# Patient Record
Sex: Female | Born: 1980 | Race: Black or African American | Hispanic: No | Marital: Single | Smoking: Never smoker
Health system: Southern US, Community
[De-identification: ages and names within clinical notes are randomized; demographics above are authoritative.]

---

## 2010-09-30 ENCOUNTER — Ambulatory Visit: Payer: Self-pay | Admitting: Family Medicine

## 2010-09-30 DIAGNOSIS — G43009 Migraine without aura, not intractable, without status migrainosus: Secondary | ICD-10-CM | POA: Insufficient documentation

## 2010-09-30 DIAGNOSIS — J069 Acute upper respiratory infection, unspecified: Secondary | ICD-10-CM | POA: Insufficient documentation

## 2010-11-22 ENCOUNTER — Ambulatory Visit
Admission: RE | Admit: 2010-11-22 | Discharge: 2010-11-22 | Payer: Self-pay | Source: Home / Self Care | Attending: Family Medicine | Admitting: Family Medicine

## 2010-11-23 NOTE — Assessment & Plan Note (Signed)
Summary: NOV: URI, HA, etc   Vital Signs:  Patient profile:   30 year old Sheryl [name] Height:      64.5 inches Weight:      121 pounds Pulse rate:   86 / minute BP sitting:   123 / 77  (right arm) Cuff size:   regular  Vitals Entered By: Avon Gully CMA, Duncan Dull) (September 30, 2010 1:07 PM) CC: NP-est care cold sx   CC:  NP-est care cold sx.  History of Present Illness: Sxs started about 7 days ago with sinus drainage and started Allegra-D.  After 4-5 days flet her voice going but a little better today. NO ST. Lots of drainage.  No congestion during day but it is worse at night.  No fever. Mild cough. Took some Theraflu a few nights a nights.  + sick contacts.   HA 2-3 x a month for years.  Usually on thr right occiput. Occ HA is sevre and gets nausee and vomiting with it.  No light our sound sensitivity.  Usually takes excedrin Migraine or rapid release Tylenol . Works about half the time. No family hx of migraines.  Usually last 2-3 days.  No worsening or alleviating sxs.   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 Single. LIves with her aunts.  Never Smoked Alcohol use-yes Drug use-no Regular exercise-no 2 caffeinated drinks per day. Smoking Status:  never Does Patient Exercise:  no STD Risk:  never Drug Use:  no Seat Belt Use:  always  Review of Systems       No fever/sweats/weakness, unexplained weight loss/gain.  No vison changes.  No difficulty hearing/ringing in ears, hay  fever/allergies.  No chest pain/discomfort, palpitations.  No Br lump/nipple discharge.  + cough/wheeze.  No blood in BM, nausea/vomiting/diarrhea.  No nighttime urination, leaking urine, unusual vaginal bleeding, discharge (penis or vagina).  No muscle/joint pain. No rash, change in mole.  No HA, memory loss.  No anxiety, sleep d/o, depression.  No easy bruising/bleeding, unexplained lump   Physical Exam  General:  Well-developed,well-nourished,in no acute distress; alert,appropriate and cooperative throughout examination Head:  Normocephalic and atraumatic without obvious abnormalities. No apparent alopecia or balding. Eyes:  No corneal or conjunctival inflammation noted. EOMI. Perrla. Ears:  External ear exam shows no significant lesions or deformities.  Otoscopic examination reveals clear canals, tympanic membranes are intact bilaterally without bulging, retraction, inflammation or discharge. Hearing is grossly normal bilaterally. Nose:  External nasal examination shows no deformity or inflammation. Nasal mucosa are pink and moist without lesions or exudates. Mouth:  Oral mucosa and oropharynx without lesions or exudates.  Teeth in good repair. Neck:  No deformities, masses, or tenderness noted. NO TM.  Lungs:  Normal respiratory effort, chest expands symmetrically. Lungs are clear to auscultation, no crackles or wheezes. Heart:  Normal rate and regular rhythm. S1 and S2 normal without gallop, murmur, click, rub or other extra sounds. Neurologic:  alert & oriented X3,  cranial nerves II-XII intact, and gait normal.   Skin:  no rashes.   Cervical Nodes:  No lymphadenopathy noted Psych:  Cognition and judgment appear intact. Alert and cooperative with normal attention span and concentration. No apparent delusions, illusions, hallucinations   Impression & Recommendations:  Problem # 1:  MIGRAINE WITHOUT AURA (ICD-346.10)  Discussed that she really has migraines.  Let her know that usually we  start with acute treatments with a tryptan. Will start with Imitrex. F/U in 6 weeks.  Can also use her excedrin migraine if needed.    Her updated medication list for this problem includes:    Sumatriptan Succinate 100 Mg Tabs (Sumatriptan succinate) .Marland Kitchen... Take 1 tablet by mouth once a day as needed migrine. repeat dose in 1 hour if needed  Problem # 2:  CONTRACEPTIVE MANAGEMENT (ICD-V25.09) Will start an OCP to help control heaving cramping.  F/U in 1-2 months for a pap smear. She has never had one.   Problem # 3:  URI (ICD-465.9)  Instructed on symptomatic treatment. Call if symptoms persist or worsen. I do think this is viral but could also be allergic. Will add a tiral of nasal steroid for 2 weeks and see if helps. Sample of omnaris give. Call if not responding.    Complete Medication List: 1)  Kariva 0.15-0.02/0.01 Mg (21/5) Tabs (Desogestrel-ethinyl estradiol) .... Take 1 tablet by mouth once a day 2)  Sumatriptan Succinate 100 Mg Tabs (Sumatriptan succinate) .... Take 1 tablet by mouth once a day as needed migrine. repeat dose in 1 hour if needed  Patient Instructions: 1)  For the HA, avoid caffeine, wine, chocolates, and aged cheeses 2)  Try to reduce your stress and get plently of rest. Lack of sleep can trigger migraine Headache.  3)  Please schedule a follow-up appointment in 6 weeks for migraines. 4)  Schedule your pap for anytime.   Prescriptions: SUMATRIPTAN SUCCINATE 100 MG TABS (SUMATRIPTAN SUCCINATE) Take 1 tablet by mouth once a day as needed migrine. Repeat dose in 1 hour if needed  #1 x 1   Entered and Authorized by:   Nani Gasser MD   Signed by:   Nani Gasser MD on 09/30/2010   Method used:   [facility] to        [facility] [phone]* (retail)       [address]       [place], [place]  [address]       Ph: [phone] or [phone]       Fax: [phone]   RxID:   [id number] KARIVA 0.15-0.02/0.01 MG (21/5) TABS (DESOGESTREL-ETHINYL  ESTRADIOL) Take 1 tablet by mouth once a day  #1 pack x 6   Entered and Authorized by:   Nani Gasser MD   Signed by:   Nani Gasser MD on 09/30/2010   Method used:   Electronically to        [facility] [phone]* (retail)       [address]       [place], [place]  [address]       Ph: [phone] or [phone]       Fax: [phone]   RxID:   [id number]    Orders Added: 1)  New Patient Level IV [93267]

## 2010-11-29 ENCOUNTER — Encounter: Payer: Self-pay | Admitting: Family Medicine

## 2010-12-01 NOTE — Assessment & Plan Note (Signed)
Summary: CHK/UP FOR MIGRAINES   Vital Signs:  Patient profile:   30 year old female Height:      64.5 inches Weight:      118 pounds Pulse rate:   90 / minute BP sitting:   117 / 75  (right arm) Cuff size:   regular  Vitals Entered By: Avon Gully CMA, Duncan Dull) (November 22, 2010 8:26 AM) CC: f/u migraines, Headache   CC:  f/u migraines and Headache.  History of Present Illness: Has had to take sumitruptan about 3-4 times. oThe imitrex makes her feel nauseated but started to work within 30 min  to one hour. Stil had some mild HA in between and has ben using Tylenol Rapid release for these.  NO vomiing wiht her migraines.  Didn'th ave to repeat her imitrex dose.   Current Medications (verified): 1)  Kariva 0.15-0.02/0.01 Mg (21/5) Tabs (Desogestrel-Ethinyl Estradiol) .... Take 1 Tablet By Mouth Once A Day 2)  Sumatriptan Succinate 100 Mg Tabs (Sumatriptan Succinate) .... Take 1 Tablet By Mouth Once A Day As Needed Migrine. Repeat Dose in 1 Hour If Needed  Allergies (verified): No Known Drug Allergies  Comments:  Nurse/Medical Assistant: The patient's medications and allergies were reviewed with the patient and were updated in the Medication and Allergy Lists. Avon Gully CMA, Duncan Dull) (November 22, 2010 8:27 AM)  Physical Exam  General:  Well-developed,well-nourished,in no acute distress; alert,appropriate and cooperative throughout examination Lungs:  Normal respiratory effort, chest expands symmetrically. Lungs are clear to auscultation, no crackles or wheezes. Heart:  Normal rate and regular rhythm. S1 and S2 normal without gallop, murmur, click, rub or other extra sounds. Neurologic:  alert & oriented X3.   Skin:  no rashes.   Psych:  Cognition and judgment appear intact. Alert and cooperative with normal attention span and concentration. No apparent delusions, illusions, hallucinations   Impression & Recommendations:  Problem # 1:  MIGRAINE WITHOUT AURA  (ICD-346.10) Imitrex worked well but caused nausea. Will chang to zomig Also discussed that she is a great candidate for prophylaxis. Discussed optoins. Will start with notriptyline. F/U in 3 months with a HA calendar.  Her updated medication list for this problem includes:    Zomig 5 Mg Tabs (Zolmitriptan) .Marland Kitchen... Take 1 tablet by mouth once a day as needed migraine headaches. can repeat dose in 2 hours if needed.  Complete Medication List: 1)  Kariva 0.15-0.02/0.01 Mg (21/5) Tabs (Desogestrel-ethinyl estradiol) .... Take 1 tablet by mouth once a day 2)  Zomig 5 Mg Tabs (Zolmitriptan) .... Take 1 tablet by mouth once a day as needed migraine headaches. can repeat dose in 2 hours if needed. 3)  Nortriptyline Hcl 10 Mg Caps (Nortriptyline hcl) .... Take 1 tablet by mouth once a day  Patient Instructions: 1)  Follow up with me in 3 months 2)  Call if not responding to the zomig rescue medication 3)  Keep a headache calendar as well 4)  Can increase the nortryptiline at bedtime to 2 tabs after one month if needed.  Prescriptions: NORTRIPTYLINE HCL 10 MG CAPS (NORTRIPTYLINE HCL) Take 1 tablet by mouth once a day  #30 x 2   Entered and Authorized by:   Nani Gasser MD   Signed by:   Nani Gasser MD on 11/22/2010   Method used:   [facility]        [facility] [address] [phone]* (retail)       [address]  [place], [place]  [address]       Ph: [phone] or [phone]       Fax: [phone]   RxID:   [id number] ZOMIG 5 MG TABS (ZOLMITRIPTAN) Take 1 tablet by mouth once a day as needed migraine headaches. Can repeat dose in 2 hours if needed.  #6 x 0   Entered and Authorized by:   Nani Gasser MD   Signed by:   Nani Gasser MD on 11/22/2010   Method used:   [facility]ically to        [facility] [phone]* (retail)       [address]       [place], [place]  [address]       Ph: [phone] or [phone]       Fax: [phone]   RxID:    [id number]    Orders Added: 1)  Est. Patient Level III [628[address]]

## 2010-12-09 ENCOUNTER — Telehealth: Payer: Self-pay | Admitting: Family Medicine

## 2010-12-15 NOTE — Progress Notes (Signed)
Summary: Zomig too expensive  Phone Note From Pharmacy   Summary of Call: Pt can not afford Zomig and requests this be changed to cheaper Rx. Please advise. Initial call taken by: Payton Spark CMA,  December 09, 2010 1:33 PM  Follow-up for Phone Call        New one sent. If also too much then she will have to talk to her pharmacistabout what may be on her formulary.  Follow-up by: Nani Gasser MD,  December 09, 2010 1:37 PM  Additional Follow-up for Phone Call Additional follow up Details #1::        left message on pts vm Additional Follow-up by: Avon Gully CMA, Duncan Dull),  December 10, 2010 8:37 AM    New/Updated Medications: RELPAX 20 MG TABS (ELETRIPTAN HYDROBROMIDE) Take 1 tablet by mouth once a day. Can repeat dose in 2 hours if needed. No more than 2 tabs in one day. Prescriptions: RELPAX 20 MG TABS (ELETRIPTAN HYDROBROMIDE) Take 1 tablet by mouth once a day. Can repeat dose in 2 hours if needed. No more than 2 tabs in one day.  #6 x 0   Entered and Authorized by:   Nani Gasser MD   Signed by:   Nani Gasser MD on 12/09/2010   Method used:   [facility] to        [facility] [phone][facility] (retail)       [address]       [place], [place]  [address]       Ph: [phone] or [phone]       Fax: [phone]   RxID:   [id number]

## 2010-12-21 NOTE — Medication Information (Signed)
Summary: Zomig Approved  Zomig Approved   Imported By: Maryln Gottron 12/13/2010 14:12:16  _____________________________________________________________________  External Attachment:    Type:   Image     Comment:   External Document

## 2010-12-24 ENCOUNTER — Encounter: Payer: Self-pay | Admitting: Family Medicine

## 2010-12-28 ENCOUNTER — Encounter: Payer: Self-pay | Admitting: Family Medicine

## 2011-01-25 ENCOUNTER — Encounter: Payer: Self-pay | Admitting: Family Medicine

## 2011-02-03 ENCOUNTER — Encounter: Payer: Self-pay | Admitting: Family Medicine

## 2011-02-04 ENCOUNTER — Encounter: Payer: Self-pay | Admitting: Family Medicine

## 2011-02-04 ENCOUNTER — Ambulatory Visit (INDEPENDENT_AMBULATORY_CARE_PROVIDER_SITE_OTHER): Payer: BC Managed Care – PPO | Admitting: Family Medicine

## 2011-02-04 ENCOUNTER — Other Ambulatory Visit (HOSPITAL_COMMUNITY)
Admission: RE | Admit: 2011-02-04 | Discharge: 2011-02-04 | Disposition: A | Discharge status: Home / Self Care | Payer: BC Managed Care – PPO | Source: Ambulatory Visit | Attending: Family Medicine | Admitting: Family Medicine

## 2011-02-04 VITALS — BP 106/73 | HR 81 | Ht 64.5 in | Wt 119.0 lb

## 2011-02-04 DIAGNOSIS — Z124 Encounter for screening for malignant neoplasm of cervix: Secondary | ICD-10-CM

## 2011-02-04 DIAGNOSIS — Z23 Encounter for immunization: Secondary | ICD-10-CM

## 2011-02-04 DIAGNOSIS — Z01419 Encounter for gynecological examination (general) (routine) without abnormal findings: Secondary | ICD-10-CM | POA: Insufficient documentation

## 2011-02-04 LAB — LIPID PANEL
HDL: 52 mg/dL (ref >39)
LDL Cholesterol: 89 mg/dL (ref 0–99)
Triglycerides: 37 mg/dL (ref <150)
VLDL: 7 mg/dL (ref 0–40)

## 2011-02-04 LAB — CBC
MCHC: 34.7 g/dL (ref 30.0–36.0)
RDW: 12.5 % (ref 11.5–15.5)
WBC: 8.7 10*3/uL (ref 4.0–10.5)

## 2011-02-04 NOTE — Patient Instructions (Signed)
Regular exercise and healthy diet Make sure getting 4 servings of dairy a day or take a calcium supplement.

## 2011-02-04 NOTE — Progress Notes (Signed)
  Subjective:    Patient ID: Sheryl [name], female    DOB: [birth year], 30 y.o.   MRN: [id number]  HPI  No concerns.   Review of Systems     Objective:   Physical Exam        Assessment & Plan:   Subjective:     Sheryl [name] is a 30 y.o. female and is here for a comprehensive physical exam. The patient reports no problems.  History   Social History  . Marital Status: Single    Spouse Name: N/A    Number of Children: N/A  . Years of Education: N/A   Occupational History  . Teacher of Technology     [employer]   Social History Main Topics  . Smoking status: Never Smoker   . Smokeless tobacco: Not on file  . Alcohol Use: Yes  . Drug Use: No  . Sexually Active: Not Currently   Other Topics Concern  . Not on file   Social History Narrative  . No narrative on file   Health Maintenance  Topic Date Due  . Pap Smear  11/12/1999  . Tetanus/tdap  11/11/2000    The following portions of the patient's history were reviewed and updated as appropriate: allergies, current medications, past family history, past medical history, past social history, past surgical history and problem list.  Review of Systems Pertinent items are noted in HPI.   Objective:    General appearance: alert and pale Head: Normocephalic, without obvious abnormality Eyes: conjunctivae/corneas clear. PERRL, EOM's intact. Fundi benign. Ears: normal TM's and external ear canals both ears Nose: Nares normal. Septum midline. Mucosa normal. No drainage or sinus tenderness. Throat: lips, mucosa, and tongue normal; teeth and gums normal Neck: no adenopathy, no carotid bruit, no JVD, supple, symmetrical, trachea midline and thyroid not enlarged, symmetric, no tenderness/mass/nodules Back: symmetric, no curvature. ROM normal. No CVA tenderness. Lungs: clear to auscultation bilaterally Breasts: normal appearance, no masses or tenderness Heart: regular rate and rhythm, S1, S2 normal, no murmur, click,  rub or gallop Abdomen: soft, non-tender; bowel sounds normal; no masses,  no organomegaly Pelvic: cervix normal in appearance, external genitalia normal, no adnexal masses or tenderness, no cervical motion tenderness, rectovaginal septum normal, uterus normal size, shape, and consistency and vagina normal without discharge Extremities: extremities normal, atraumatic, no cyanosis or edema Pulses: 2+ and symmetric Skin: Skin color, texture, turgor normal. No rashes or lesions Lymph nodes: Cervical, supraclavicular, and axillary nodes normal. Neurologic: Grossly normal    Assessment:    Healthy female exam. Doing well. Healthy      Plan:     See After Visit Summary for Counseling Recommendations  Due for screening labs. Will call with pap results Encourage daily exercise and healthy diet.  Tdap given today.

## 2011-02-05 LAB — COMPLETE METABOLIC PANEL WITH GFR
ALT: 10 U/L (ref 0–35)
AST: 11 U/L (ref 0–37)
CO2: 24 mEq/L (ref 19–32)
Calcium: 9 mg/dL (ref 8.4–10.5)
Chloride: 105 mEq/L (ref 96–112)
Creat: 0.82 mg/dL (ref 0.40–1.20)
Sodium: 141 mEq/L (ref 135–145)
Total Bilirubin: 0.4 mg/dL (ref 0.3–1.2)
Total Protein: 7.1 g/dL (ref 6.0–8.3)

## 2011-02-06 ENCOUNTER — Telehealth: Payer: Self-pay | Admitting: Family Medicine

## 2011-02-06 NOTE — Telephone Encounter (Signed)
Call patient: Labs including cholesterol looked great.

## 2011-02-08 ENCOUNTER — Telehealth: Payer: Self-pay | Admitting: Family Medicine

## 2011-02-08 NOTE — Telephone Encounter (Signed)
Left message on vm

## 2011-02-08 NOTE — Telephone Encounter (Signed)
Call pt: Normal pap. Repeat in 1 yr.

## 2011-02-09 NOTE — Telephone Encounter (Signed)
Left message on vm

## 2011-12-28 ENCOUNTER — Encounter: Payer: Self-pay | Admitting: *Deleted

## 2011-12-28 ENCOUNTER — Emergency Department
Admission: EM | Admit: 2011-12-28 | Discharge: 2011-12-28 | Disposition: A | Discharge status: Home / Self Care | Payer: BC Managed Care – PPO | Source: Home / Self Care | Attending: Emergency Medicine | Admitting: Emergency Medicine

## 2011-12-28 DIAGNOSIS — J069 Acute upper respiratory infection, unspecified: Secondary | ICD-10-CM

## 2011-12-28 LAB — POCT RAPID STREP A (OFFICE): Rapid Strep A Screen: NEGATIVE

## 2011-12-28 MED ORDER — OSELTAMIVIR PHOSPHATE 75 MG PO CAPS: 75.0000 mg | ORAL_CAPSULE | Freq: Two times a day (BID) | ORAL | Status: DC

## 2011-12-28 NOTE — ED Provider Notes (Signed)
History     CSN: [id number]  Arrival date & time 12/28/11  1016   First MD Initiated Contact with Patient 12/28/11 1040      Chief Complaint  Patient presents with  . Fever  . Cough    (Consider location/radiation/quality/duration/timing/severity/associated sxs/prior treatment) HPI Sheryl [name] is a 31 y.o. female who complains of onset of cold symptoms for 2 days.   History of allergies and maybe childhood asthma (?) since she remembers having bronchitis as a child. + sore throat + cough No pleuritic pain No wheezing +nasal congestion + post-nasal drainage + sinus pain/pressure + chest congestion No itchy/red eyes + earache No hemoptysis No SOB + chills/sweats + fever No nausea No vomiting No abdominal pain No diarrhea No skin rashes + fatigue + myalgias + headache    History reviewed. No pertinent past medical history.  History reviewed. No pertinent past surgical history.  Family History  Problem Relation Age of Onset  . Hypertension Mother   . Hypertension Other   . Arthritis Mother   . Hyperlipidemia Mother     History  Substance Use Topics  . Smoking status: Never Smoker   . Smokeless tobacco: Not on file  . Alcohol Use: Yes    OB History    Grav Para Term Preterm Abortions TAB SAB Ect Mult Living                  Review of Systems  All other systems reviewed and are negative.    Allergies  Review of patient's allergies indicates no known allergies.  Home Medications   Current Outpatient Rx  Name Route Sig Dispense Refill  . NORTRIPTYLINE HCL 10 MG PO CAPS Oral Take 10 mg by mouth daily.      . OSELTAMIVIR PHOSPHATE 75 MG PO CAPS Oral Take 1 capsule (75 mg total) by mouth 2 (two) times daily. 10 capsule 0    BP 111/76  Pulse 133  Temp(Src) 102.7 F (39.3 C) (Oral)  Resp 14  Ht 5\' 4"  (1.626 m)  Wt 116 lb (52.617 kg)  BMI 19.91 kg/m2  SpO2 98%  Physical Exam  Nursing note and vitals reviewed. Constitutional: She is oriented  to person, place, and time. She appears well-developed and well-nourished.  Non-toxic appearance. She has a sickly appearance (diaphoretic).  HENT:  Head: Normocephalic and atraumatic.  Right Ear: Tympanic membrane, external ear and ear canal normal.  Left Ear: Tympanic membrane, external ear and ear canal normal.  Nose: Mucosal edema and rhinorrhea present.  Mouth/Throat: Posterior oropharyngeal erythema present. No oropharyngeal exudate or posterior oropharyngeal edema.  Eyes: No scleral icterus.  Neck: Neck supple. No Brudzinski's sign and no Kernig's sign noted.  Cardiovascular: Regular rhythm and normal heart sounds.   Pulmonary/Chest: Effort normal. No respiratory distress. She has wheezes (minimal mild scattered). She has no rhonchi.  Neurological: She is alert and oriented to person, place, and time.  Skin: Skin is warm and dry.  Psychiatric: She has a normal mood and affect. Her speech is normal.    ED Course  Procedures (including critical care time)   Labs Reviewed  POCT RAPID STREP A (OFFICE)  STREP A DNA PROBE   No results found.   1. Acute upper respiratory infections of unspecified site   2. Influenza-like illness       MDM  1)  Rapid strep negative, throat culture pending, Rx for Tamiflu given.  If worsening, consider CXR. 2)  Use nasal saline solution (over the counter)  at least 3 times a day. 3)  Use over the counter decongestants like Zyrtec-D every 12 hours as needed to help with congestion.  If you have hypertension, do not take medicines with sudafed.  4)  Can take tylenol every 6 hours or motrin every 8 hours for pain or fever. 5)  Follow up with your primary doctor if no improvement in 5-7 days, sooner if increasing pain, fever, or new symptoms.        Lily Kocher, MD 12/28/11 1050

## 2011-12-28 NOTE — ED Notes (Signed)
Patient c/o fever, body aches and dry cough x 2 days. Taken mucous relief. She did not receive a flu vaccine this year.

## 2011-12-29 LAB — STREP A DNA PROBE: GASP: NEGATIVE

## 2011-12-30 ENCOUNTER — Telehealth: Payer: Self-pay | Admitting: *Deleted

## 2012-01-02 ENCOUNTER — Telehealth: Payer: Self-pay | Admitting: Emergency Medicine

## 2012-01-03 ENCOUNTER — Other Ambulatory Visit: Payer: Self-pay | Admitting: Family Medicine

## 2012-01-03 ENCOUNTER — Encounter: Payer: Self-pay | Admitting: Family Medicine

## 2012-01-03 ENCOUNTER — Ambulatory Visit
Admission: RE | Admit: 2012-01-03 | Discharge: 2012-01-03 | Disposition: A | Discharge status: Home / Self Care | Payer: BC Managed Care – PPO | Source: Ambulatory Visit | Attending: Family Medicine | Admitting: Family Medicine

## 2012-01-03 ENCOUNTER — Ambulatory Visit (INDEPENDENT_AMBULATORY_CARE_PROVIDER_SITE_OTHER): Payer: BC Managed Care – PPO | Admitting: Family Medicine

## 2012-01-03 DIAGNOSIS — J40 Bronchitis, not specified as acute or chronic: Secondary | ICD-10-CM

## 2012-01-03 DIAGNOSIS — R0602 Shortness of breath: Secondary | ICD-10-CM

## 2012-01-03 MED ORDER — BENZONATATE 200 MG PO CAPS: 200.0000 mg | ORAL_CAPSULE | Freq: Three times a day (TID) | ORAL | Status: DC | PRN

## 2012-01-03 MED ORDER — LEVOFLOXACIN 750 MG PO TABS: 750.0000 mg | ORAL_TABLET | Freq: Every day | ORAL | Status: AC

## 2012-01-03 NOTE — Patient Instructions (Signed)

## 2012-01-03 NOTE — Progress Notes (Signed)
  Subjective:    Patient ID: Sheryl [name], female    DOB: 12-10-1980, 31 y.o.   MRN: [id number]  HPI Has been sick for 9 days.  Moslty cough, chest congestion. Wet cough. Some SOB.  Went to UC last week dand told likely flu. Given tamiflu. Completed all the med.  She is still having low grade fevers but not as high.  Given cough med too but hasn't really taken it.  No GI sxs currently.  Had neg strep test. No ST today.  SOB mostly with coughing fits.  No hx of asthma.     Review of Systems     Objective:   Physical Exam  Constitutional: She is oriented to person, place, and time. She appears well-developed and well-nourished.  HENT:  Head: Normocephalic and atraumatic.  Right Ear: External ear normal.  Left Ear: External ear normal.  Nose: Nose normal.  Mouth/Throat: Oropharynx is clear and moist.       TMs and canals are clear.   Eyes: Conjunctivae and EOM are normal. Pupils are equal, round, and reactive to light.  Neck: Neck supple. No thyromegaly present.  Cardiovascular: Normal rate, regular rhythm and normal heart sounds.   Pulmonary/Chest: Effort normal and breath sounds normal. She has no wheezes.  Lymphadenopathy:    She has no cervical adenopathy.  Neurological: She is alert and oriented to person, place, and time.  Skin: Skin is warm and dry.  Psychiatric: She has a normal mood and affect.          Assessment & Plan:  Bornchitis - Some better but still SOB and coughing with dec pulse ox to 95%. Will get CXR to eval for PNA.  Rest, hydration. Will try tessalon perles for for cough. Continue Tylenol for fever. Will call with results later today.

## 2012-01-13 ENCOUNTER — Telehealth: Payer: Self-pay | Admitting: *Deleted

## 2012-01-13 DIAGNOSIS — J189 Pneumonia, unspecified organism: Secondary | ICD-10-CM

## 2012-01-13 MED ORDER — ALBUTEROL SULFATE HFA 108 (90 BASE) MCG/ACT IN AERS: 2.0000 | INHALATION_SPRAY | Freq: Four times a day (QID) | RESPIRATORY_TRACT | Status: AC | PRN

## 2012-01-13 NOTE — Telephone Encounter (Signed)
LMOM for pt to get CXR done and rx sent to pharmacy.

## 2012-01-13 NOTE — Telephone Encounter (Signed)
Pt states she is still having SHOB adn cough and would like to know if you can send her an inhaler to rx. Please advise.

## 2012-01-13 NOTE — Telephone Encounter (Signed)
Needs repeat CXR. Will send inhaler too. Ordered CXR.

## 2012-01-16 ENCOUNTER — Ambulatory Visit
Admission: RE | Admit: 2012-01-16 | Discharge: 2012-01-16 | Disposition: A | Discharge status: Home / Self Care | Payer: BC Managed Care – PPO | Source: Ambulatory Visit | Attending: Family Medicine | Admitting: Family Medicine

## 2012-01-16 DIAGNOSIS — J189 Pneumonia, unspecified organism: Secondary | ICD-10-CM

## 2012-01-23 ENCOUNTER — Encounter: Payer: Self-pay | Admitting: Physician Assistant

## 2012-01-23 ENCOUNTER — Ambulatory Visit (INDEPENDENT_AMBULATORY_CARE_PROVIDER_SITE_OTHER): Payer: BC Managed Care – PPO | Admitting: Physician Assistant

## 2012-01-23 VITALS — BP 115/67 | HR 95 | Temp 98.3°F | Ht 64.0 in | Wt 118.0 lb

## 2012-01-23 DIAGNOSIS — J189 Pneumonia, unspecified organism: Secondary | ICD-10-CM

## 2012-01-23 MED ORDER — PREDNISONE 10 MG PO TABS: 20.0000 mg | ORAL_TABLET | Freq: Every day | ORAL | Status: DC

## 2012-01-23 MED ORDER — BENZONATATE 200 MG PO CAPS: 200.0000 mg | ORAL_CAPSULE | Freq: Three times a day (TID) | ORAL | Status: AC | PRN

## 2012-01-23 NOTE — Progress Notes (Signed)
  Subjective:    Patient ID: Sheryl [name], female    DOB: March 30, 1981, 30 y.o.   MRN: [id number]  HPI Patient presents to the clinic for follow up on pneumonia. Patient was seen last week in urgent care for pneumonia. She was put on Levaquin. She has finished the Levaquin and got a repeat chest x-ray on 01/13/12. The pneumonia had significantly cleared. Patient still reports feeling SOB and like her chest is tight. She uses her albuterol inhaler 3 times a day. She has also used the [employer] which did help with cough. She denies running a fever in the past 4 days, having chills, sweats, nausea, or vomiting. She still has a residual cough with no production.    Review of Systems     Objective:   Physical Exam  Constitutional: She appears well-developed and well-nourished.  Cardiovascular: Normal rate, regular rhythm and normal heart sounds.   Pulmonary/Chest: Effort normal and breath sounds normal. She has no wheezes.       SPO2 was 99 percent.   Skin: Skin is warm and dry.  Psychiatric: She has a normal mood and affect. Her behavior is normal.          Assessment & Plan:  Pneumonia- STart prednisone 40mg  for 5 days. Continue with albuterol as needed for SOB. Tessalon pearles for cough as needed up to three times a day. Reassured patient that she may not feel normal for a couple of weeks because it takes a while to feel better.

## 2012-01-23 NOTE — Patient Instructions (Signed)
STart prednisone 40mg  for 5 days. Continue with albuterol as needed for SOB. Tessalon pearles for cough as needed up to three times a day.

## 2012-06-14 ENCOUNTER — Ambulatory Visit (INDEPENDENT_AMBULATORY_CARE_PROVIDER_SITE_OTHER): Payer: BC Managed Care – PPO | Admitting: Family Medicine

## 2012-06-14 ENCOUNTER — Encounter: Payer: Self-pay | Admitting: Family Medicine

## 2012-06-14 VITALS — BP 93/65 | HR 90 | Temp 98.0°F | Resp 16 | Ht 64.0 in | Wt 130.0 lb

## 2012-06-14 DIAGNOSIS — L0390 Cellulitis, unspecified: Secondary | ICD-10-CM

## 2012-06-14 DIAGNOSIS — L02419 Cutaneous abscess of limb, unspecified: Secondary | ICD-10-CM

## 2012-06-14 MED ORDER — SULFAMETHOXAZOLE-TRIMETHOPRIM 800-160 MG PO TABS: ORAL_TABLET | ORAL | Status: AC

## 2012-06-14 NOTE — Progress Notes (Signed)
The CC: Sheryl [name] is a 31 y.o. female is here for Boil on leg   Subjective: HPI: Monday patient noticed swelling and discomfort left proximal posterior thigh. Yesterday noticed redness was worsening. On the way here there was mild drainage. No interventions as of yet. Has never had lesions like this before. Seems to be enlarging since onset. Denies fevers, chills, palpitations, flushing, nausea, abdominal pain. Denies trauma to affected area.    Review Of Systems Outlined In HPI  No past medical history on file.   Family History  Problem Relation Age of Onset  . Hypertension Mother   . Hypertension Other   . Arthritis Mother   . Hyperlipidemia Mother      History  Substance Use Topics  . Smoking status: Never Smoker   . Smokeless tobacco: Never Used  . Alcohol Use: Yes     Objective: Filed Vitals:   06/14/12 0851  BP: 93/65  Pulse: 90  Temp: 98 F (36.7 C)  Resp: 16    General: Alert and Oriented, No Acute Distress HEENT:   Moist mucous membranes Lungs: Clear to auscultation bilaterally, no wheezing/ronchi/rales.  Comfortable work of breathing. Good air movement. Cardiac: Regular rate and rhythm. Normal S1/S2.  No murmurs, rubs, nor gallops.   Extremities: No peripheral edema.  Strong peripheral pulses. Just under left lateral horizontal gluteal cleft shows a papule with central pustule and mild induration which is tender.  Peripherally ~3cm (sparing superiorly) erythema. Mental Status: No depression, anxiety, nor agitation. Skin: Warm and dry.  Assessment & Plan: Sheryl [name] was seen today for boil on leg.  Diagnoses and associated orders for this visit:  Cellulitis and abscess - sulfamethoxazole-trimethoprim (SEPTRA DS) 800-160 MG per tablet; One by mouth twice a day for ten days.    Consent obtained from patient will be scanned into medical record. See procedure note below. Bactrim for accompanying cellulits. Encouraged patient to return to urgent care on  Saturday if she feels that she's not improved considerably, encouraged her to check the peripheral marked line to ensure that redness is not expanding.   Incision and Drainage Procedure Note  Pre-operative Diagnosis: Abscess and cellulits  Post-operative Diagnosis: same  Indications: Pain and infection  Anesthesia: 2cc 2%lido with epi  Procedure Details  The procedure, risks and complications have been discussed in detail (including, but not limited to, infection, bleeding, nerve damage) with the patient, and the patient has signed consent to the procedure.  The skin was sterilely prepped and draped over the affected area in the usual fashion. After adequate local anesthesia, I&D with a #11 blade was performed on the left posterior proximal thigh. Purulent drainage: present and scant The patient was observed until stable.  Findings: Clean incision  EBL: 1 cc's  Drains: none needed  Condition: Stable  Complications: none.      Return if symptoms worsen or fail to improve.  Requested Prescriptions   Signed Prescriptions Disp Refills  . sulfamethoxazole-trimethoprim (SEPTRA DS) 800-160 MG per tablet 20 tablet 0    Sig: One by mouth twice a day for ten days.

## 2012-06-14 NOTE — Patient Instructions (Signed)
Come to urgent care on Saturday for a recheck if the redness is expanding beyond the marked line from Thursday.  Incision and Drainage of Abscess An abscess (boil or furuncle) is an area infected by germs that contains a collection of pus. Signs and problems (symptoms) of an abscess include pain, tenderness, redness, or hardness. You may feel a moveable, soft area under your skin. An abscess can occur anywhere in the body. Occasionally, this may spread to surrounding tissues causing cellulitis. Sometimes, a surgeon may make a cut (incision) over your abscess. The pus is drained. Gauze may be packed into the space to provide a drain. Keeping a drain or piece of gauze in the incision keeps the skin from healing first. This helps stop the abscess from forming again. The area may be painful for 5 to 7 days. Most people with an abscess do not have high fevers. If seen early, your abscess may not have localized and may not be cut. If it does not get better on its own or with medicines, you may require another appointment. HOME CARE INSTRUCTIONS   Only take over-the-counter or prescription medicines for pain, discomfort, or fever as directed by your caregiver. Use these only if your caregiver has not given medicines that would interfere.   When you bathe, remove the gauze drain after soaking. You may then wash the wound gently with mild, soapy water. Repack with gauze as your caregiver directs.   See your caregiver as directed for a recheck.   If antibiotics were prescribed, take them as directed.  SEEK MEDICAL CARE IF:   You develop increased pain, swelling, redness, drainage, or bleeding in the wound site.   You develop signs of generalized infection, including muscle aches, chills, or a general ill feeling.   You or your child has an oral temperature above 102 F (38.9 C).  MAKE SURE YOU:   Understand these instructions.   Will watch your condition.   Will get help right away if you are not  doing well or get worse.  Document Released: 04/05/2001 Document Revised: 06/22/2011 Document Reviewed: 05/30/2008 [facility] Patient Information 2012 [place], [place].

## 2012-06-21 ENCOUNTER — Encounter: Payer: BC Managed Care – PPO | Admitting: Family Medicine

## 2012-07-27 ENCOUNTER — Encounter: Payer: BC Managed Care – PPO | Admitting: Family Medicine

## 2012-07-27 DIAGNOSIS — Z0289 Encounter for other administrative examinations: Secondary | ICD-10-CM

## 2012-11-25 IMAGING — CR DG CHEST 2V
2 series · 2 of 2 positions shown · non-contrast
Comparison: 01/03/2012

CLINICAL DATA: Pneumonia.  Shortness of breath.

CHEST - 2 VIEW

[view not recorded (1 of 2)]
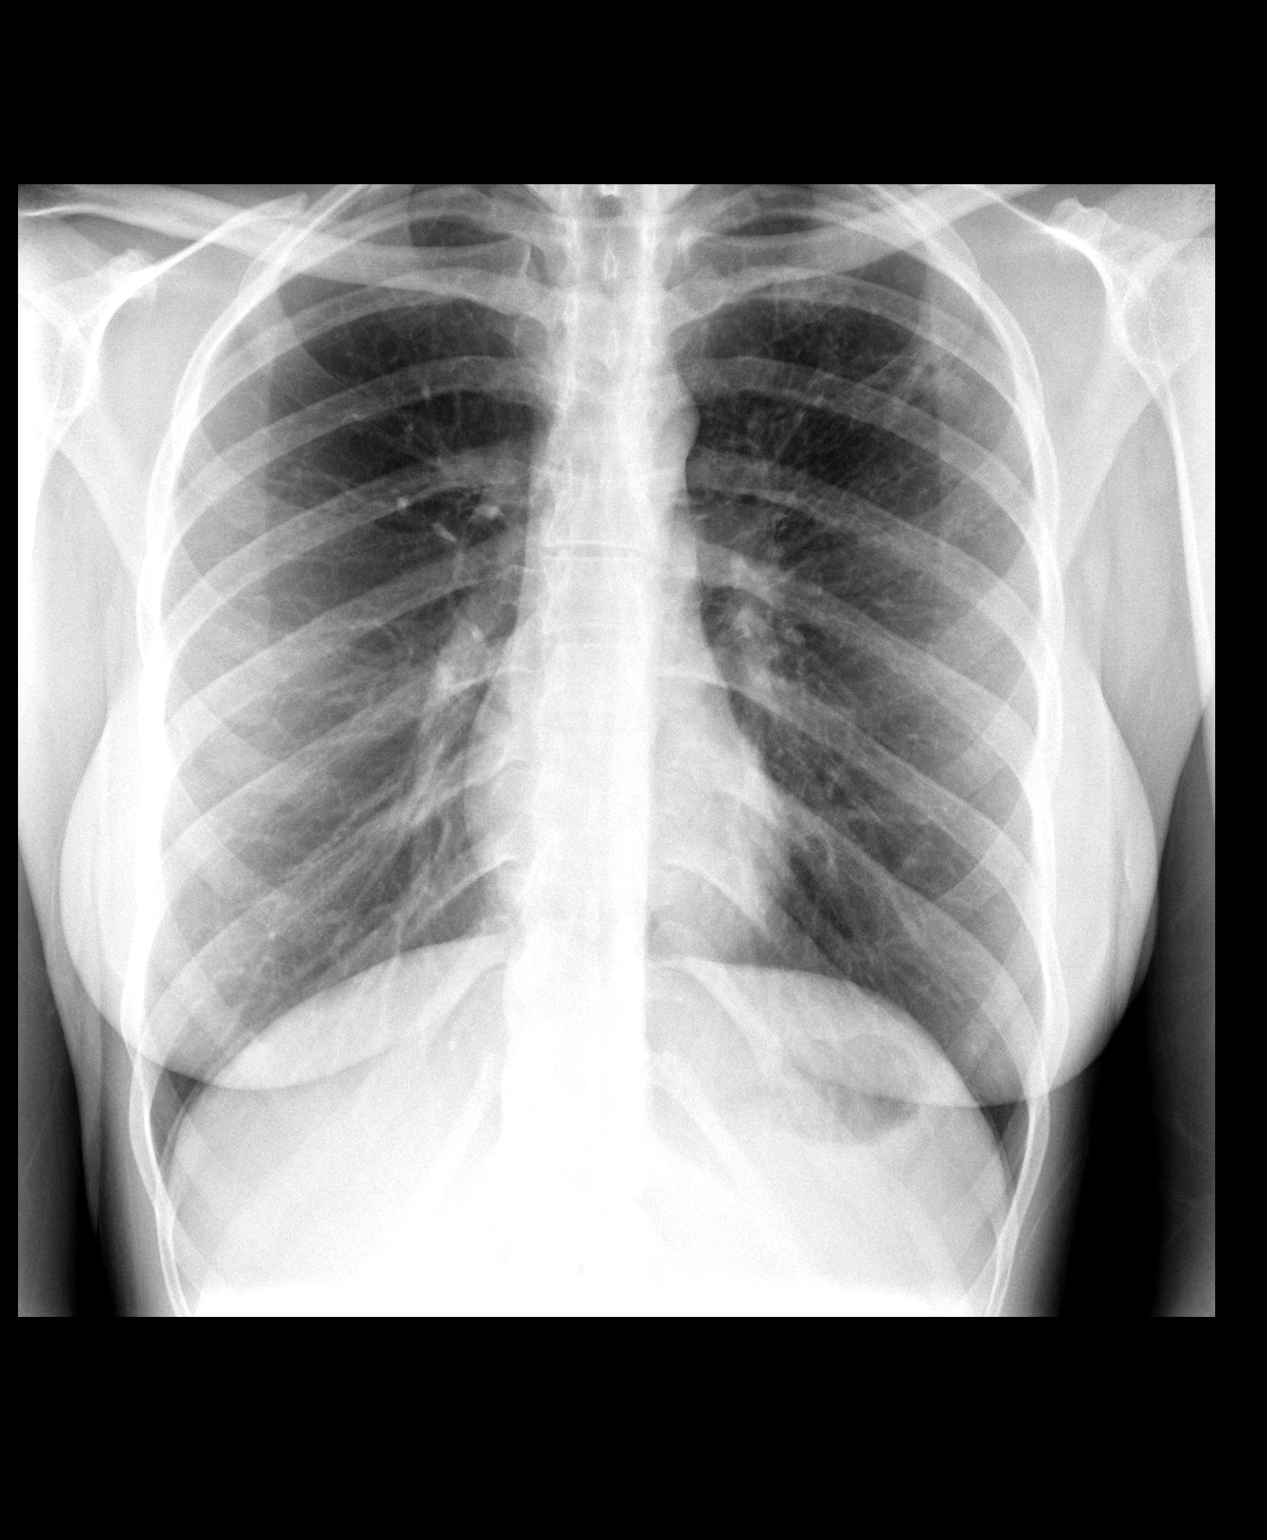

[view not recorded (2 of 2)]
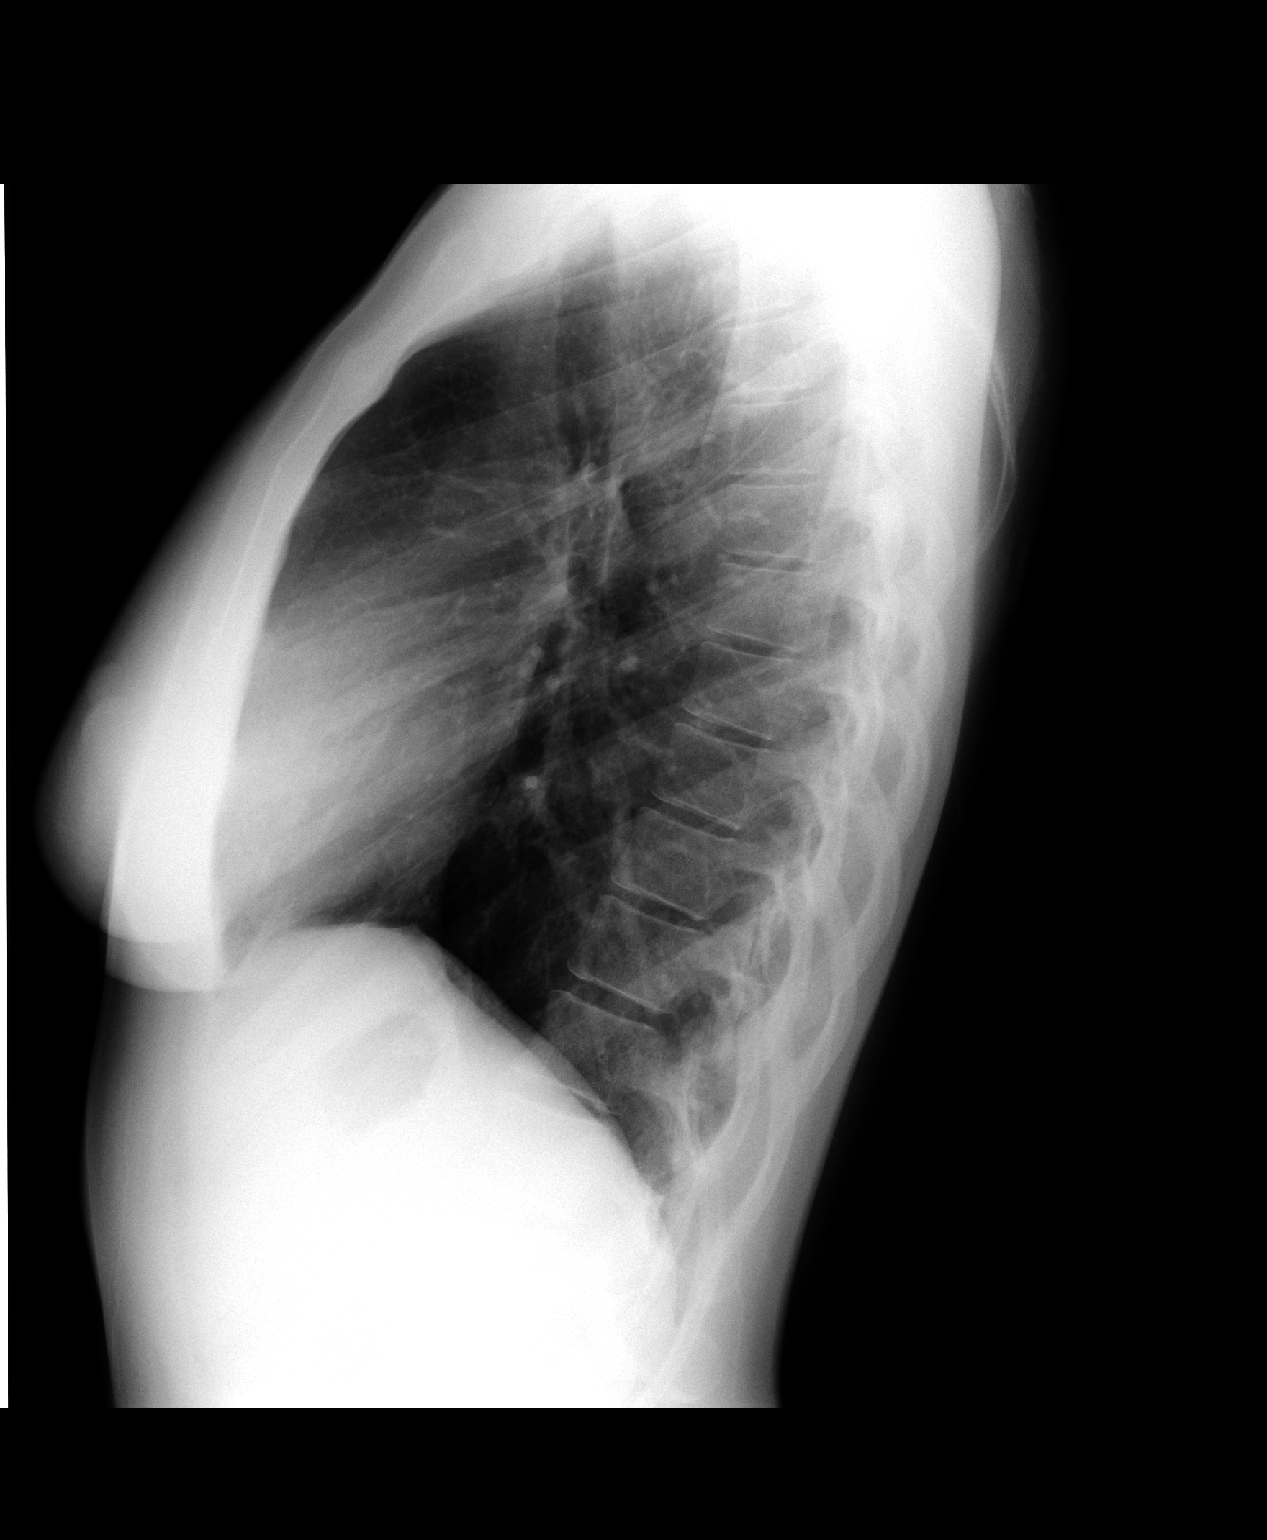

[2 of 2 positions shown; findings below may reference images not displayed]

FINDINGS: Right lung is clear.  The patchy airspace disease seen in
the left upper lobe on the previous study has improved in the
interval with only a small area of residual airspace opacity
evident. The cardiopericardial silhouette is within normal limits
for size.  Imaged bony structures of the thorax are intact.
IMPRESSION: Interval improvement without resolution of the patchy left upper
lobe airspace opacity.

## 2014-10-29 ENCOUNTER — Ambulatory Visit (INDEPENDENT_AMBULATORY_CARE_PROVIDER_SITE_OTHER): Payer: BC Managed Care – PPO | Admitting: Family Medicine

## 2014-10-29 ENCOUNTER — Encounter: Payer: Self-pay | Admitting: Family Medicine

## 2014-10-29 VITALS — BP 126/76 | HR 89 | Ht 64.0 in | Wt 155.0 lb

## 2014-10-29 DIAGNOSIS — J0110 Acute frontal sinusitis, unspecified: Secondary | ICD-10-CM

## 2014-10-29 MED ORDER — AMOXICILLIN-POT CLAVULANATE 875-125 MG PO TABS: 1.0000 | ORAL_TABLET | Freq: Two times a day (BID) | ORAL | Status: DC

## 2014-10-29 NOTE — Progress Notes (Signed)
   Subjective:    Patient ID: [place] Pink, female    DOB: [birth year], 34 y.o.   MRN: [id number]  HPI Cough x 1 wk using cough drops and tylenol sinus denies any fevers, chills or sweats. + nasal congestion, worse at night.  No ST or ear pain. Says cough is occ productive. More so at night. + post nasal drip. Feels like she is getting worse the last 2 days.  Has had bad HA x 3 days though HA is better today.   Review of Systems     Objective:   Physical Exam  Constitutional: She is oriented to person, place, and time. She appears well-developed and well-nourished.  HENT:  Head: Normocephalic and atraumatic.  Right Ear: External ear normal.  Left Ear: External ear normal.  Nose: Nose normal.  Mouth/Throat: Oropharynx is clear and moist.  TMs and canals are clear.   Eyes: Conjunctivae and EOM are normal. Pupils are equal, round, and reactive to light.  Neck: Neck supple. No thyromegaly present.  Cardiovascular: Normal rate, regular rhythm and normal heart sounds.   Pulmonary/Chest: Effort normal and breath sounds normal. She has no wheezes.  Lymphadenopathy:    She has no cervical adenopathy.  Neurological: She is alert and oriented to person, place, and time.  Skin: Skin is warm and dry.  Psychiatric: She has a normal mood and affect.          Assessment & Plan:  Acute sinusitis - will tx with augmentin. Call if not better in one week. [facility] for sympomatic care.

## 2014-10-31 ENCOUNTER — Telehealth: Payer: Self-pay

## 2014-10-31 NOTE — Telephone Encounter (Signed)
Agree with below. We can call in Tessalon perles if OTC products note helping

## 2014-10-31 NOTE — Telephone Encounter (Signed)
Sheryl [name] reports her cough is not any better. I advised her that the cough could last a couple of weeks. Advised her to try Sudafed and Mucinex and to call back if she has a worsening of symptoms or fever, chills or sweats.

## 2017-02-21 ENCOUNTER — Encounter: Payer: Self-pay | Admitting: Family Medicine

## 2017-02-21 ENCOUNTER — Ambulatory Visit (INDEPENDENT_AMBULATORY_CARE_PROVIDER_SITE_OTHER): Payer: BC Managed Care – PPO | Admitting: Family Medicine

## 2017-02-21 ENCOUNTER — Other Ambulatory Visit (HOSPITAL_COMMUNITY)
Admission: RE | Admit: 2017-02-21 | Discharge: 2017-02-21 | Disposition: A | Discharge status: Home / Self Care | Payer: BC Managed Care – PPO | Source: Ambulatory Visit | Attending: Family Medicine | Admitting: Family Medicine

## 2017-02-21 VITALS — BP 121/71 | HR 74 | Ht 64.0 in | Wt 175.0 lb

## 2017-02-21 DIAGNOSIS — Z0000 Encounter for general adult medical examination without abnormal findings: Secondary | ICD-10-CM | POA: Diagnosis present

## 2017-02-21 DIAGNOSIS — M25474 Effusion, right foot: Secondary | ICD-10-CM

## 2017-02-21 DIAGNOSIS — M25471 Effusion, right ankle: Secondary | ICD-10-CM

## 2017-02-21 LAB — COMPLETE METABOLIC PANEL WITH GFR
ALT: 9 U/L (ref 6–29)
AST: 12 U/L (ref 10–30)
Albumin: 3.9 g/dL (ref 3.6–5.1)
Alkaline Phosphatase: 47 U/L (ref 33–115)
BUN: 13 mg/dL (ref 7–25)
CALCIUM: 8.7 mg/dL (ref 8.6–10.2)
CHLORIDE: 108 mmol/L (ref 98–110)
CO2: 25 mmol/L (ref 20–31)
Creat: 0.79 mg/dL (ref 0.50–1.10)
Glucose, Bld: 89 mg/dL (ref 65–99)
Potassium: 4.1 mmol/L (ref 3.5–5.3)
Sodium: 140 mmol/L (ref 135–146)
Total Bilirubin: 0.2 mg/dL (ref 0.2–1.2)
Total Protein: 7.1 g/dL (ref 6.1–8.1)

## 2017-02-21 LAB — URIC ACID: URIC ACID, SERUM: 5 mg/dL (ref 2.5–7.0)

## 2017-02-21 LAB — LIPID PANEL W/REFLEX DIRECT LDL
CHOLESTEROL: 149 mg/dL (ref <200)
HDL: 40 mg/dL — AB (ref >50)
LDL-CHOLESTEROL: 94 mg/dL
Non-HDL Cholesterol (Calc): 109 mg/dL (ref <130)
TRIGLYCERIDES: 60 mg/dL (ref <150)
Total CHOL/HDL Ratio: 3.7 Ratio (ref <5.0)

## 2017-02-21 LAB — C-REACTIVE PROTEIN: CRP: 5.6 mg/L (ref <8.0)

## 2017-02-21 LAB — D-DIMER, QUANTITATIVE (NOT AT ARMC): D DIMER QUANT: 0.2 ug{FEU}/mL (ref <0.50)

## 2017-02-21 LAB — SEDIMENTATION RATE: Sed Rate: 14 mm/hr (ref 0–20)

## 2017-02-21 LAB — TSH: TSH: 1.24 mIU/L

## 2017-02-21 NOTE — Progress Notes (Signed)
Subjective:     Sheryl [name] is a 36 y.o. female and is here for a comprehensive physical exam. The patient reports problems - right lower leg, ankle and foot swelling. she says she's had intermittent swelling of the right ankle and foot and lower leg since 2002. She says years ago she actually did some blood work and the never really could figure out why it was happening. It really does not affect any other joint in her body. She says it feels uncomfortable and feels tight because of the swelling but it's not painful enough that she cannot walk on it. Interestingly her mother has a diagnosis of psoriatic arthritis and her maternal grandfather had a diagnosis of gout. Previously her swelling was triggered if she did a lot of excessive walking and that it would swell. Now it can happen at any time. It's currently swollen today.  Social History   Social History  . Marital status: Single    Spouse name: N/A  . Number of children: N/A  . Years of education: N/A   Occupational History  . Teacher of Technology     [employer]   Social History Main Topics  . Smoking status: Never Smoker  . Smokeless tobacco: Never Used  . Alcohol use Yes  . Drug use: No  . Sexual activity: Not Currently   Other Topics Concern  . Not on file   Social History Narrative  . No narrative on file   Health Maintenance  Topic Date Due  . HIV Screening  11/11/1996  . PAP SMEAR  02/03/2014  . INFLUENZA VACCINE  05/24/2017  . TETANUS/TDAP  02/03/2021    The following portions of the patient's history were reviewed and updated as appropriate: allergies, current medications, past family history, past medical history, past social history, past surgical history and problem list.  Review of Systems A comprehensive review of systems was negative.   Objective:    BP 121/71   Pulse 74   Ht 5\' 4"  (1.626 m)   Wt 175 lb (79.4 kg)   LMP 02/13/2017 (Approximate)   SpO2 99%   BMI 30.04 kg/m  General appearance:  alert, cooperative and appears stated age Head: Normocephalic, without obvious abnormality, atraumatic Eyes: conj clear, EOMI, PEERLA Ears: normal TM's and external ear canals both ears Nose: Nares normal. Septum midline. Mucosa normal. No drainage or sinus tenderness. Throat: lips, mucosa, and tongue normal; teeth and gums normal Neck: no adenopathy, no carotid bruit, no JVD, supple, symmetrical, trachea midline and thyroid not enlarged, symmetric, no tenderness/mass/nodules Back: symmetric, no curvature. ROM normal. No CVA tenderness. Lungs: clear to auscultation bilaterally Breasts: normal appearance, no masses or tenderness Heart: regular rate and rhythm, S1, S2 normal, no murmur, click, rub or gallop Abdomen: soft, non-tender; bowel sounds normal; no masses,  no organomegaly Pelvic: cervix normal in appearance, external genitalia normal, no adnexal masses or tenderness, no cervical motion tenderness, rectovaginal septum normal, uterus normal size, shape, and consistency and vagina normal without discharge Extremities: extremities normal, atraumatic, no cyanosis or edema Pulses: 2+ and symmetric Skin: Skin color, texture, turgor normal. No rashes or lesions Lymph nodes: Cervical, supraclavicular, and axillary nodes normal. Neurologic: Alert and oriented X 3, normal strength and tone. Normal symmetric reflexes. Normal coordination and gait    Assessment:    Healthy female exam.      Plan:     See After Visit Summary for Counseling Recommendations   Keep up a regular exercise program and make sure  you are eating a healthy diet Try to eat 4 servings of dairy a day, or if you are lactose intolerant take a calcium with vitamin D daily.  Your vaccines are up to date.  Due for CMP and fasting lipid panel. Will check a hemoglobin A1c she also has a family history of diabetes. Will call with Pap smear results once available.  Right lower leg/ankle and foot swelling-unclear etiology but I  am to do some additional blood work to rule out elevation and inflammatory markers. I am to check a uric acid since she does have a family history of gout. Will check d-dimer just to rule out a DVT. She had an aunt who passed away in her 65s from a pulmonary embolism. Recommend a trial of anti-inflammatory such as Aleve or ibuprofen for swelling and pain relief. Call if not improving over the next couple of weeks.

## 2017-02-21 NOTE — Patient Instructions (Addendum)
Health Maintenance, Female Adopting a healthy lifestyle and getting preventive care can go a long way to promote health and wellness. Talk with your health care provider about what schedule of regular examinations is right for you. This is a good chance for you to check in with your provider about disease prevention and staying healthy. In between checkups, there are plenty of things you can do on your own. Experts have done a lot of research about which lifestyle changes and preventive measures are most likely to keep you healthy. Ask your health care provider for more information. Weight and diet Eat a healthy diet  Be sure to include plenty of vegetables, fruits, low-fat dairy products, and lean protein.  Do not eat a lot of foods high in solid fats, added sugars, or salt.  Get regular exercise. This is one of the most important things you can do for your health.  Most adults should exercise for at least 150 minutes each week. The exercise should increase your heart rate and make you sweat (moderate-intensity exercise).  Most adults should also do strengthening exercises at least twice a week. This is in addition to the moderate-intensity exercise. Maintain a healthy weight  Body mass index (BMI) is a measurement that can be used to identify possible weight problems. It estimates body fat based on height and weight. Your health care provider can help determine your BMI and help you achieve or maintain a healthy weight.  For females 76 years of age and older:  A BMI below 18.5 is considered underweight.  A BMI of 18.5 to 24.9 is normal.  A BMI of 25 to 29.9 is considered overweight.  A BMI of 30 and above is considered obese. Watch levels of cholesterol and blood lipids  You should start having your blood tested for lipids and cholesterol at 35 years of age, then have this test every 5 years.  You may need to have your cholesterol levels checked more often if:  Your lipid or  cholesterol levels are high.  You are older than 35 years of age.  You are at high risk for heart disease. Cancer screening Lung Cancer  Lung cancer screening is recommended for adults 64-42 years old who are at high risk for lung cancer because of a history of smoking.  A yearly low-dose CT scan of the lungs is recommended for people who:  Currently smoke.  Have quit within the past 15 years.  Have at least a 30-pack-year history of smoking. A pack year is smoking an average of one pack of cigarettes a day for 1 year.  Yearly screening should continue until it has been 15 years since you quit.  Yearly screening should stop if you develop a health problem that would prevent you from having lung cancer treatment. Breast Cancer  Practice breast self-awareness. This means understanding how your breasts normally appear and feel.  It also means doing regular breast self-exams. Let your health care provider know about any changes, no matter how small.  If you are in your 20s or 30s, you should have a clinical breast exam (CBE) by a health care provider every 1-3 years as part of a regular health exam.  If you are 34 or older, have a CBE every year. Also consider having a breast X-ray (mammogram) every year.  If you have a family history of breast cancer, talk to your health care provider about genetic screening.  If you are at high risk for breast cancer, talk  to your health care provider about having an MRI and a mammogram every year.  Breast cancer gene (BRCA) assessment is recommended for women who have family members with BRCA-related cancers. BRCA-related cancers include:  Breast.  Ovarian.  Tubal.  Peritoneal cancers.  Results of the assessment will determine the need for genetic counseling and BRCA1 and BRCA2 testing. Cervical Cancer  Your health care provider may recommend that you be screened regularly for cancer of the pelvic organs (ovaries, uterus, and vagina).  This screening involves a pelvic examination, including checking for microscopic changes to the surface of your cervix (Pap test). You may be encouraged to have this screening done every 3 years, beginning at age 24.  For women ages 66-65, health care providers may recommend pelvic exams and Pap testing every 3 years, or they may recommend the Pap and pelvic exam, combined with testing for human papilloma virus (HPV), every 5 years. Some types of HPV increase your risk of cervical cancer. Testing for HPV may also be done on women of any age with unclear Pap test results.  Other health care providers may not recommend any screening for nonpregnant women who are considered low risk for pelvic cancer and who do not have symptoms. Ask your health care provider if a screening pelvic exam is right for you.  If you have had past treatment for cervical cancer or a condition that could lead to cancer, you need Pap tests and screening for cancer for at least 20 years after your treatment. If Pap tests have been discontinued, your risk factors (such as having a new sexual partner) need to be reassessed to determine if screening should resume. Some women have medical problems that increase the chance of getting cervical cancer. In these cases, your health care provider may recommend more frequent screening and Pap tests. Colorectal Cancer  This type of cancer can be detected and often prevented.  Routine colorectal cancer screening usually begins at 35 years of age and continues through 35 years of age.  Your health care provider may recommend screening at an earlier age if you have risk factors for colon cancer.  Your health care provider may also recommend using home test kits to check for hidden blood in the stool.  A small camera at the end of a tube can be used to examine your colon directly (sigmoidoscopy or colonoscopy). This is done to check for the earliest forms of colorectal cancer.  Routine  screening usually begins at age 41.  Direct examination of the colon should be repeated every 5-10 years through 35 years of age. However, you may need to be screened more often if early forms of precancerous polyps or small growths are found. Skin Cancer  Check your skin from head to toe regularly.  Tell your health care provider about any new moles or changes in moles, especially if there is a change in a mole's shape or color.  Also tell your health care provider if you have a mole that is larger than the size of a pencil eraser.  Always use sunscreen. Apply sunscreen liberally and repeatedly throughout the day.  Protect yourself by wearing long sleeves, pants, a wide-brimmed hat, and sunglasses whenever you are outside. Heart disease, diabetes, and high blood pressure  High blood pressure causes heart disease and increases the risk of stroke. High blood pressure is more likely to develop in:  People who have blood pressure in the high end of the normal range (130-139/85-89 mm Hg).  People who are overweight or obese.  People who are African American.  If you are 59-24 years of age, have your blood pressure checked every 3-5 years. If you are 34 years of age or older, have your blood pressure checked every year. You should have your blood pressure measured twice-once when you are at a hospital or clinic, and once when you are not at a hospital or clinic. Record the average of the two measurements. To check your blood pressure when you are not at a hospital or clinic, you can use:  An automated blood pressure machine at a pharmacy.  A home blood pressure monitor.  If you are between 29 years and 60 years old, ask your health care provider if you should take aspirin to prevent strokes.  Have regular diabetes screenings. This involves taking a blood sample to check your fasting blood sugar level.  If you are at a normal weight and have a low risk for diabetes, have this test once  every three years after 35 years of age.  If you are overweight and have a high risk for diabetes, consider being tested at a younger age or more often. Preventing infection Hepatitis B  If you have a higher risk for hepatitis B, you should be screened for this virus. You are considered at high risk for hepatitis B if:  You were born in a country where hepatitis B is common. Ask your health care provider which countries are considered high risk.  Your parents were born in a high-risk country, and you have not been immunized against hepatitis B (hepatitis B vaccine).  You have HIV or AIDS.  You use needles to inject street drugs.  You live with someone who has hepatitis B.  You have had sex with someone who has hepatitis B.  You get hemodialysis treatment.  You take certain medicines for conditions, including cancer, organ transplantation, and autoimmune conditions. Hepatitis C  Blood testing is recommended for:  Everyone born from [birth year] through 1964.  Anyone with known risk factors for hepatitis C. Sexually transmitted infections (STIs)  You should be screened for sexually transmitted infections (STIs) including gonorrhea and chlamydia if:  You are sexually active and are younger than 35 years of age.  You are older than 35 years of age and your health care provider tells you that you are at risk for this type of infection.  Your sexual activity has changed since you were last screened and you are at an increased risk for chlamydia or gonorrhea. Ask your health care provider if you are at risk.  If you do not have HIV, but are at risk, it may be recommended that you take a prescription medicine daily to prevent HIV infection. This is called pre-exposure prophylaxis (PrEP). You are considered at risk if:  You are sexually active and do not regularly use condoms or know the HIV status of your partner(s).  You take drugs by injection.  You are sexually active with a partner  who has HIV. Talk with your health care provider about whether you are at high risk of being infected with HIV. If you choose to begin PrEP, you should first be tested for HIV. You should then be tested every 3 months for as long as you are taking PrEP. Pregnancy  If you are premenopausal and you may become pregnant, ask your health care provider about preconception counseling.  If you may become pregnant, take 400 to 800 micrograms (mcg) of folic acid  every day.  If you want to prevent pregnancy, talk to your health care provider about birth control (contraception). Osteoporosis and menopause  Osteoporosis is a disease in which the bones lose minerals and strength with aging. This can result in serious bone fractures. Your risk for osteoporosis can be identified using a bone density scan.  If you are 4 years of age or older, or if you are at risk for osteoporosis and fractures, ask your health care provider if you should be screened.  Ask your health care provider whether you should take a calcium or vitamin D supplement to lower your risk for osteoporosis.  Menopause may have certain physical symptoms and risks.  Hormone replacement therapy may reduce some of these symptoms and risks. Talk to your health care provider about whether hormone replacement therapy is right for you. Follow these instructions at home:  Schedule regular health, dental, and eye exams.  Stay current with your immunizations.  Do not use any tobacco products including cigarettes, chewing tobacco, or electronic cigarettes.  If you are pregnant, do not drink alcohol.  If you are breastfeeding, limit how much and how often you drink alcohol.  Limit alcohol intake to no more than 1 drink per day for nonpregnant women. One drink equals 12 ounces of beer, 5 ounces of wine, or 1 ounces of hard liquor.  Do not use street drugs.  Do not share needles.  Ask your health care provider for help if you need support  or information about quitting drugs.  Tell your health care provider if you often feel depressed.  Tell your health care provider if you have ever been abused or do not feel safe at home. This information is not intended to replace advice given to you by your health care provider. Make sure you discuss any questions you have with your health care provider. Document Released: 04/25/2011 Document Revised: 03/17/2016 Document Reviewed: 07/14/2015 Elsevier Interactive Patient Education  2017 [employer].

## 2017-02-22 LAB — HEMOGLOBIN A1C
Hgb A1c MFr Bld: 5.3 % (ref <5.7)
MEAN PLASMA GLUCOSE: 105 mg/dL

## 2017-02-22 NOTE — Addendum Note (Signed)
Addended by: Teddy Spike on: 02/22/2017 08:03 AM   Modules accepted: Orders

## 2017-02-22 NOTE — Addendum Note (Signed)
Addended by: Beatrice Lecher D on: 02/22/2017 09:03 AM   Modules accepted: Orders

## 2017-02-24 ENCOUNTER — Ambulatory Visit (INDEPENDENT_AMBULATORY_CARE_PROVIDER_SITE_OTHER): Payer: BC Managed Care – PPO

## 2017-02-24 DIAGNOSIS — M25471 Effusion, right ankle: Secondary | ICD-10-CM | POA: Diagnosis not present

## 2017-02-24 DIAGNOSIS — M25474 Effusion, right foot: Secondary | ICD-10-CM

## 2017-02-24 LAB — CYTOLOGY - PAP
Diagnosis: NEGATIVE
HPV (WINDOPATH): NOT DETECTED

## 2017-02-27 NOTE — Progress Notes (Signed)
Call patient: Your Pap smear is normal. Repeat in 5 years.

## 2017-03-21 ENCOUNTER — Ambulatory Visit (INDEPENDENT_AMBULATORY_CARE_PROVIDER_SITE_OTHER): Payer: BC Managed Care – PPO | Admitting: Family Medicine

## 2017-03-21 ENCOUNTER — Encounter: Payer: Self-pay | Admitting: Family Medicine

## 2017-03-21 VITALS — BP 123/71 | HR 85 | Ht 64.0 in | Wt 179.0 lb

## 2017-03-21 DIAGNOSIS — M25561 Pain in right knee: Secondary | ICD-10-CM

## 2017-03-21 DIAGNOSIS — M25471 Effusion, right ankle: Secondary | ICD-10-CM

## 2017-03-21 DIAGNOSIS — D171 Benign lipomatous neoplasm of skin and subcutaneous tissue of trunk: Secondary | ICD-10-CM | POA: Diagnosis not present

## 2017-03-21 NOTE — Progress Notes (Signed)
   Subjective:    Patient ID: Sheryl [name], female    DOB: 12-Nov-1980, 36 y.o.   MRN: [id number]  HPI 36 year old female comes in today complaining of persistent right foot and ankle swelling. We did some blood work just to rule out a blood clot, gout, or autoimmune disorder. She had normal CRP and sedimentation rate. We also did an x-ray of the ankle began to rule out any significant arthritis. Since I last saw her she's also been having some intermittent right knee pain.She says it actually feels better now that hurt for a few days and was a little bit swollen. She was using a wedge pillow to elevate it and using a compression stocking and that did seem to help. She says her mom has significant arthritis of her knees.  She also has a lump on her right low back near the spine. Has been there for awhile.  Not painful or bothersome. No recent change in size, etc.   Review of Systems     Objective:   Physical Exam  Constitutional: She is oriented to person, place, and time. She appears well-developed and well-nourished.  HENT:  Head: Normocephalic and atraumatic.  Eyes: Conjunctivae and EOM are normal.  Cardiovascular: Normal rate.   Pulmonary/Chest: Effort normal.  Musculoskeletal:  Right ankle with normal range of motion. Dorsal pedal pulse 1+. She has pitting edema on the top of the foot and laterally over the ankle. No significant edema of the tibia. No abnormal discoloration and no wounds.right knee is non-tender.  No sig swelling today.    Neurological: She is alert and oriented to person, place, and time.  Skin: Skin is dry. No pallor.  On her right low back she has a brand right over the spine. To the right of that she has a palpable lipoma.  Psychiatric: She has a normal mood and affect. Her behavior is normal.  Vitals reviewed.       Assessment & Plan:  Right ankle pain - Recommend trial of compression stockings.Digital her for venous ultrasound to look for reflux of the  veins. Consider that this could be related to venous stasis. If not improving and test is normal then we'll refer her to sports medicine for further evaluation and possible MRI.    Right knee pain - Improved today.    Lipoma - Gave reassurance.

## 2017-04-13 ENCOUNTER — Ambulatory Visit (HOSPITAL_COMMUNITY)
Admission: RE | Admit: 2017-04-13 | Discharge: 2017-04-13 | Disposition: A | Discharge status: Home / Self Care | Payer: BC Managed Care – PPO | Source: Ambulatory Visit | Attending: Cardiology | Admitting: Cardiology

## 2017-04-13 DIAGNOSIS — M25471 Effusion, right ankle: Secondary | ICD-10-CM | POA: Diagnosis not present

## 2017-04-21 ENCOUNTER — Ambulatory Visit (INDEPENDENT_AMBULATORY_CARE_PROVIDER_SITE_OTHER): Payer: BC Managed Care – PPO | Admitting: Sports Medicine

## 2017-04-21 ENCOUNTER — Ambulatory Visit (INDEPENDENT_AMBULATORY_CARE_PROVIDER_SITE_OTHER): Payer: BC Managed Care – PPO

## 2017-04-21 DIAGNOSIS — M25561 Pain in right knee: Secondary | ICD-10-CM

## 2017-04-21 DIAGNOSIS — M7989 Other specified soft tissue disorders: Secondary | ICD-10-CM | POA: Diagnosis not present

## 2017-04-21 DIAGNOSIS — M25562 Pain in left knee: Secondary | ICD-10-CM

## 2017-04-21 MED ORDER — MELOXICAM 15 MG PO TABS: ORAL_TABLET | ORAL | 3 refills | Status: DC

## 2017-04-21 NOTE — Progress Notes (Signed)
   Subjective:    I'm seeing this patient as a consultation for:  Dr. Beatrice Lecher  CC: Right leg swelling  HPI: This is a 36 year old female with a history of psoriasis, she comes in with a multiple month history of swelling in her right lower extremity, with pain in the posterior medial joint line of the knee and swelling down in the lower leg and ankle without pain in the ankle. She had a venous reflux ultrasound that was negative. Pain is moderate, persistent, she does get significant gelling in the right knee, no mechanical symptoms. No constitutional symptoms. No trauma.  Past medical history:  Negative.  See flowsheet/record as well for more information.  Surgical history: Negative.  See flowsheet/record as well for more information.  Family history: Negative.  See flowsheet/record as well for more information.  Social history: Negative.  See flowsheet/record as well for more information.  Allergies, and medications have been entered into the medical record, reviewed, and no changes needed.   Review of Systems: No headache, visual changes, nausea, vomiting, diarrhea, constipation, dizziness, abdominal pain, skin rash, fevers, chills, night sweats, weight loss, swollen lymph nodes, body aches, joint swelling, muscle aches, chest pain, shortness of breath, mood changes, visual or auditory hallucinations.   Objective:   General: Well Developed, well nourished, and in no acute distress.  Neuro/Psych: Alert and oriented x3, extra-ocular muscles intact, able to move all 4 extremities, sensation grossly intact. Skin: Warm and dry, no rashes noted.  Respiratory: Not using accessory muscles, speaking in full sentences, trachea midline.  Cardiovascular: Pulses palpable, no extremity edema. Abdomen: Does not appear distended. Right Knee: Slight visible fullness with tenderness at the posterior medial joint line, reproduction of pain with terminal flexion. She does have fusiform swelling  of the entire right lower extremity good pulses, edema is 2+ pitting. Ankle exam is otherwise unremarkable as well. ROM normal in flexion and extension and lower leg rotation. Ligaments with solid consistent endpoints including ACL, PCL, LCL, MCL. Negative Mcmurray's and provocative meniscal tests. Non painful patellar compression. Patellar and quadriceps tendons unremarkable. Hamstring and quadriceps strength is normal.  Impression and Recommendations:   This case required medical decision making of moderate complexity.  Right leg swelling With some medial joint line knee pain, no mechanical symptoms, and dependent lower extremity swelling. She really doesn't have any pain on the ankle. She does have psoriasis, this raises the suspicion for psoriatic arthritis of the knee resulting in dependent edema. Venous reflux ultrasound was negative, and a bedside ultrasound today didn't show enough of the joint effusion for arthrocentesis, and I did not see a Baker's cyst. We will start conservatively with x-rays, rehabilitation exercises, and meloxicam daily for the next month. Return to see me in one month, if still having swelling in the leg and pain in the knee we will do an MRI of her knee.

## 2017-04-21 NOTE — Assessment & Plan Note (Signed)
With some medial joint line knee pain, no mechanical symptoms, and dependent lower extremity swelling. She really doesn't have any pain on the ankle. She does have psoriasis, this raises the suspicion for psoriatic arthritis of the knee resulting in dependent edema. Venous reflux ultrasound was negative, and a bedside ultrasound today didn't show enough of the joint effusion for arthrocentesis, and I did not see a Baker's cyst. We will start conservatively with x-rays, rehabilitation exercises, and meloxicam daily for the next month. Return to see me in one month, if still having swelling in the leg and pain in the knee we will do an MRI of her knee.

## 2017-05-19 ENCOUNTER — Ambulatory Visit: Payer: BC Managed Care – PPO | Admitting: Sports Medicine

## 2017-05-25 ENCOUNTER — Ambulatory Visit (INDEPENDENT_AMBULATORY_CARE_PROVIDER_SITE_OTHER): Payer: BC Managed Care – PPO | Admitting: Sports Medicine

## 2017-05-25 ENCOUNTER — Encounter: Payer: Self-pay | Admitting: Sports Medicine

## 2017-05-25 DIAGNOSIS — G8929 Other chronic pain: Secondary | ICD-10-CM

## 2017-05-25 DIAGNOSIS — M25561 Pain in right knee: Secondary | ICD-10-CM | POA: Diagnosis not present

## 2017-05-25 NOTE — Progress Notes (Signed)
  Subjective:    CC: Follow-up  HPI: This is a pleasant 36 year old female with a history of psoriasis, for the past several months she's had pain and swelling on her right knee with mostly medial joint line pain, occasional mechanical symptoms. I saw her sometime ago, at this point she's failed greater than 6 weeks of physical therapy, NSAIDs, activity modification. X-rays have been negative.  Past medical history:  Negative.  See flowsheet/record as well for more information.  Surgical history: Negative.  See flowsheet/record as well for more information.  Family history: Negative.  See flowsheet/record as well for more information.  Social history: Negative.  See flowsheet/record as well for more information.  Allergies, and medications have been entered into the medical record, reviewed, and no changes needed.   Review of Systems: No fevers, chills, night sweats, weight loss, chest pain, or shortness of breath.   Objective:    General: Well Developed, well nourished, and in no acute distress.  Neuro: Alert and oriented x3, extra-ocular muscles intact, sensation grossly intact.  HEENT: Normocephalic, atraumatic, pupils equal round reactive to light, neck supple, no masses, no lymphadenopathy, thyroid nonpalpable.  Skin: Warm and dry, no rashes. Cardiac: Regular rate and rhythm, no murmurs rubs or gallops, no lower extremity edema.  Respiratory: Clear to auscultation bilaterally. Not using accessory muscles, speaking in full sentences. Right Knee: Visibly swollen with a palpable fluid wave, tenderness at the medial joint line. ROM normal in flexion and extension and lower leg rotation. Ligaments with solid consistent endpoints including ACL, PCL, LCL, MCL. Negative Mcmurray's and provocative meniscal tests. Non painful patellar compression. Patellar and quadriceps tendons unremarkable. Hamstring and quadriceps strength is normal.  Impression and Recommendations:    Right knee  pain I still think this is psoriatic arthritis. Meloxicam was moderately effective, she still does have some pain. At this point proceed with an MRI, she failed greater than 6 weeks of physician directed conservative measures. If I see synovitis, arthritis, or even degenerative meniscal tearing I will do a knee injection. Her psoriasis is well controlled.  I spent 25 minutes with this patient, greater than 50% was face-to-face time counseling regarding the above diagnoses

## 2017-05-25 NOTE — Assessment & Plan Note (Signed)
I still think this is psoriatic arthritis. Meloxicam was moderately effective, she still does have some pain. At this point proceed with an MRI, she failed greater than 6 weeks of physician directed conservative measures. If I see synovitis, arthritis, or even degenerative meniscal tearing I will do a knee injection. Her psoriasis is well controlled.

## 2017-06-05 ENCOUNTER — Ambulatory Visit (INDEPENDENT_AMBULATORY_CARE_PROVIDER_SITE_OTHER): Payer: BC Managed Care – PPO

## 2017-06-05 DIAGNOSIS — G8929 Other chronic pain: Secondary | ICD-10-CM

## 2017-06-05 DIAGNOSIS — M25561 Pain in right knee: Secondary | ICD-10-CM

## 2017-06-13 ENCOUNTER — Ambulatory Visit (INDEPENDENT_AMBULATORY_CARE_PROVIDER_SITE_OTHER): Payer: BC Managed Care – PPO | Admitting: Sports Medicine

## 2017-06-13 ENCOUNTER — Encounter: Payer: Self-pay | Admitting: Sports Medicine

## 2017-06-13 DIAGNOSIS — G8929 Other chronic pain: Secondary | ICD-10-CM | POA: Diagnosis not present

## 2017-06-13 DIAGNOSIS — M25561 Pain in right knee: Secondary | ICD-10-CM | POA: Diagnosis not present

## 2017-06-13 NOTE — Progress Notes (Signed)
  Subjective:    CC: follow-up  HPI: Right knee pain:This is a pleasant 36 year old female, we treated her conservatively for her knee pain, ultimately she needed an MRI the results of which will be dictated below. Continues to have pain, moderate, persistent, localized the medial joint line without radiation.  Past medical history:  Negative.  See flowsheet/record as well for more information.  Surgical history: Negative.  See flowsheet/record as well for more information.  Family history: Negative.  See flowsheet/record as well for more information.  Social history: Negative.  See flowsheet/record as well for more information.  Allergies, and medications have been entered into the medical record, reviewed, and no changes needed.   Review of Systems: No fevers, chills, night sweats, weight loss, chest pain, or shortness of breath.   Objective:    General: Well Developed, well nourished, and in no acute distress.  Neuro: Alert and oriented x3, extra-ocular muscles intact, sensation grossly intact.  HEENT: Normocephalic, atraumatic, pupils equal round reactive to light, neck supple, no masses, no lymphadenopathy, thyroid nonpalpable.  Skin: Warm and dry, no rashes. Cardiac: Regular rate and rhythm, no murmurs rubs or gallops, no lower extremity edema.  Respiratory: Clear to auscultation bilaterally. Not using accessory muscles, speaking in full sentences.  MRI shows degenerative changes in the medial tibiofemoral compartment as well as degenerative edema in the medial meniscus posterior horn  Procedure: Real-time Ultrasound Guided Injection of right knee Device: GE Logiq E  Verbal informed consent obtained.  Time-out conducted.  Noted no overlying erythema, induration, or other signs of local infection.  Skin prepped in a sterile fashion.  Local anesthesia: Topical Ethyl chloride.  With sterile technique and under real time ultrasound guidance:  1 mL Kenalog 40, 2 mL lidocaine, 2 mL  bupivacaine injected easily Completed without difficulty  Pain immediately resolved suggesting accurate placement of the medication.  Advised to call if fevers/chills, erythema, induration, drainage, or persistent bleeding.  Images permanently stored and available for review in the ultrasound unit.  Impression: Technically successful ultrasound guided injection.  Impression and Recommendations:    Right knee pain MRI shows medial compartment degenerative changes and a degenerative meniscal tear. Failed conservative measures, injection as above.  Return to see me in one month.

## 2017-06-13 NOTE — Assessment & Plan Note (Signed)
MRI shows medial compartment degenerative changes and a degenerative meniscal tear. Failed conservative measures, injection as above.  Return to see me in one month.

## 2017-07-11 ENCOUNTER — Encounter: Payer: Self-pay | Admitting: Sports Medicine

## 2017-07-11 ENCOUNTER — Ambulatory Visit (INDEPENDENT_AMBULATORY_CARE_PROVIDER_SITE_OTHER): Payer: BC Managed Care – PPO | Admitting: Sports Medicine

## 2017-07-11 DIAGNOSIS — M25561 Pain in right knee: Secondary | ICD-10-CM

## 2017-07-11 DIAGNOSIS — G8929 Other chronic pain: Secondary | ICD-10-CM

## 2017-07-11 NOTE — Assessment & Plan Note (Signed)
MRI shows some medial compartment degenerative with a degenerative meniscal tear. Failed injection but really doesn't have tremendous pain, no mechanical symptoms, and is functional. She does not desire to proceed to arthroscopy, and does not desire to consider viscosupplementation. Adding a reaction knee brace, she will continue her rehabilitation exercises, and return to see me when she is ready to consider arthroscopy.

## 2017-07-11 NOTE — Progress Notes (Signed)
  Subjective:    CC: Follow-up  HPI: Right knee osteoarthritis: Also with a degenerative meniscal tear on MRI, injection provided good temporary relief, unfortunately she fell off a ladder and her her knee again. She has essentially back to baseline pain, worse at the medial joint line, she noted approximately 30-40% relief in her pain after the injection. No mechanical symptoms, able to ambulate and work.  Past medical history:  Negative.  See flowsheet/record as well for more information.  Surgical history: Negative.  See flowsheet/record as well for more information.  Family history: Negative.  See flowsheet/record as well for more information.  Social history: Negative.  See flowsheet/record as well for more information.  Allergies, and medications have been entered into the medical record, reviewed, and no changes needed.   Review of Systems: No fevers, chills, night sweats, weight loss, chest pain, or shortness of breath.   Objective:    General: Well Developed, well nourished, and in no acute distress.  Neuro: Alert and oriented x3, extra-ocular muscles intact, sensation grossly intact.  HEENT: Normocephalic, atraumatic, pupils equal round reactive to light, neck supple, no masses, no lymphadenopathy, thyroid nonpalpable.  Skin: Warm and dry, no rashes. Cardiac: Regular rate and rhythm, no murmurs rubs or gallops, no lower extremity edema.  Respiratory: Clear to auscultation bilaterally. Not using accessory muscles, speaking in full sentences. Right Knee: Normal to inspection with no erythema or effusion or obvious bony abnormalities. Palpation normal with no warmth or joint line tenderness or patellar tenderness or condyle tenderness. ROM normal in flexion and extension and lower leg rotation. Ligaments with solid consistent endpoints including ACL, PCL, LCL, MCL. Negative Mcmurray's and provocative meniscal tests. Non painful patellar compression. Patellar and quadriceps  tendons unremarkable. Hamstring and quadriceps strength is normal.  Impression and Recommendations:    Right knee pain MRI shows some medial compartment degenerative with a degenerative meniscal tear. Failed injection but really doesn't have tremendous pain, no mechanical symptoms, and is functional. She does not desire to proceed to arthroscopy, and does not desire to consider viscosupplementation. Adding a reaction knee brace, she will continue her rehabilitation exercises, and return to see me when she is ready to consider arthroscopy.   I spent 25 minutes with this patient, greater than 50% was face-to-face time counseling regarding the above diagnoses ___________________________________________ Gwen Her. Dianah Field, M.D., ABFM., CAQSM. Primary Care and [place] Instructor of [place] of [facility]

## 2017-07-19 ENCOUNTER — Telehealth: Payer: Self-pay

## 2017-07-19 DIAGNOSIS — Z87828 Personal history of other (healed) physical injury and trauma: Secondary | ICD-10-CM

## 2017-07-19 NOTE — Telephone Encounter (Signed)
Spoke with pt and she is ready to have referral placed to have knee surgery done. Please assist.

## 2017-07-19 NOTE — Telephone Encounter (Signed)
Done

## 2018-04-15 IMAGING — MR MR KNEE*R* W/O CM
6 series · 40 of 40 positions shown · non-contrast
Comparison: None.

CLINICAL DATA: Swelling, pain along the medial and posterior
aspect.

EXAM:
MRI OF THE RIGHT KNEE WITHOUT CONTRAST
TECHNIQUE: Multiplanar, multisequence MR imaging of the knee was performed. No
intravenous contrast was administered.

[Series 3: PD fat-sat · axial · 3.0mm · 0.33mm/px · z∈[-70,+42]mm · 7 of 35 slices shown (1 of 4)]
[im 1/35]
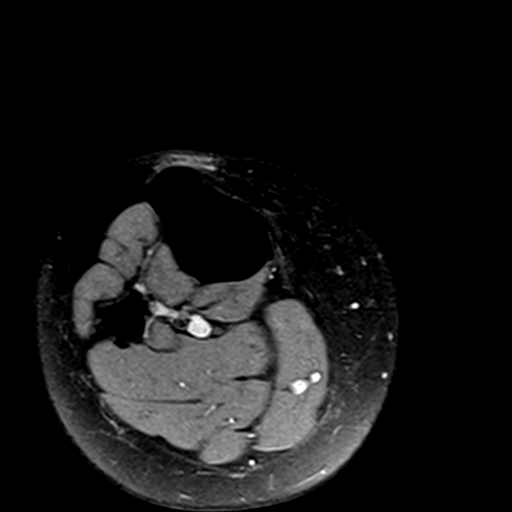
[im 6/35]
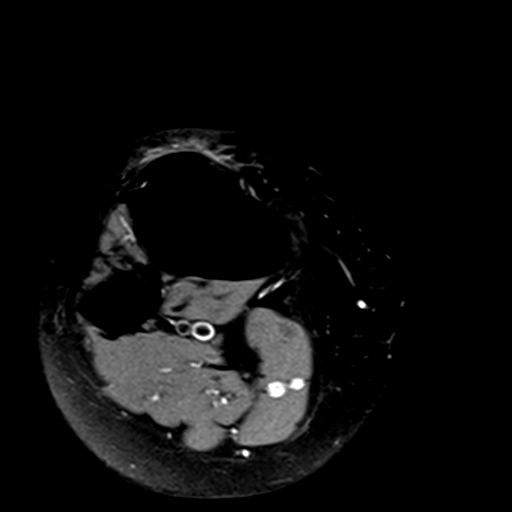
[im 12/35]
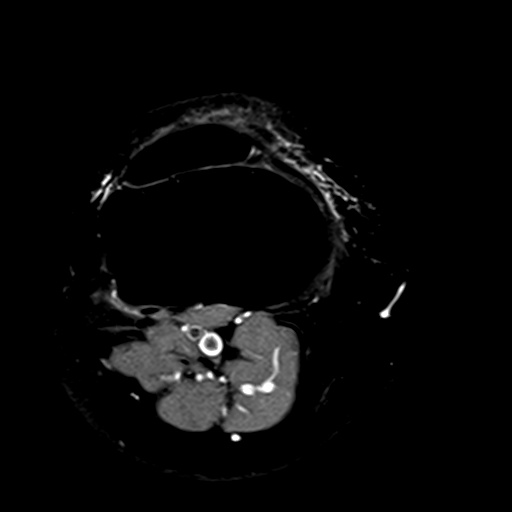
[im 18/35]
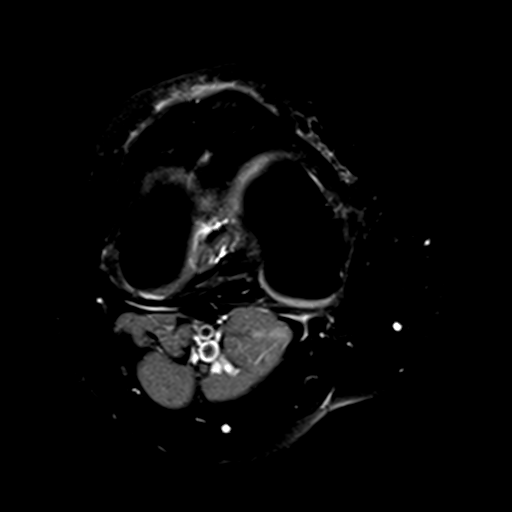
[im 23/35]
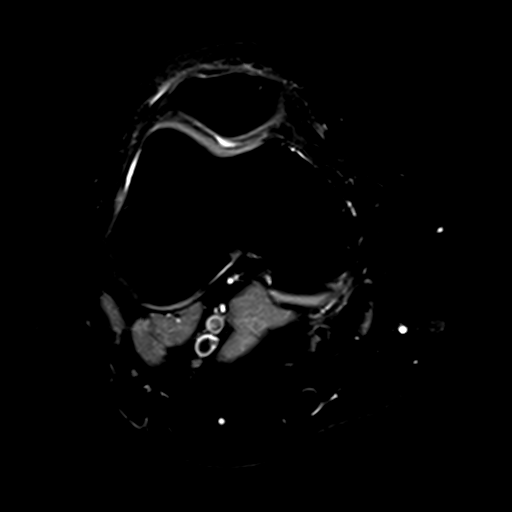
[im 29/35]
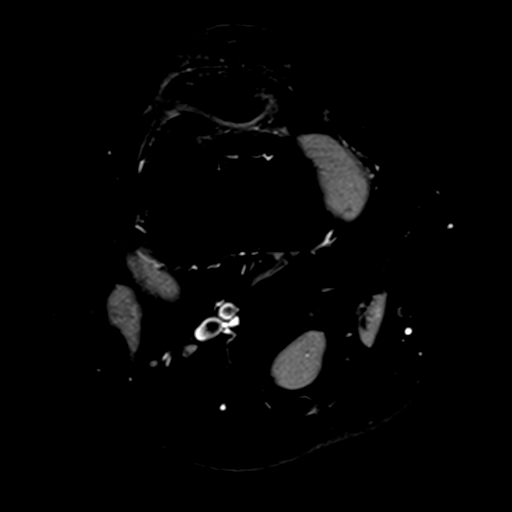
[im 35/35]
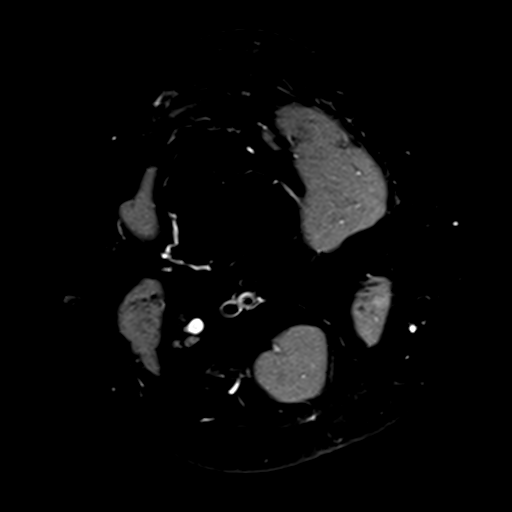

[Series 4: T1 · coronal · 3.0mm · 0.50mm/px · 7 of 31 slices shown]
[im 1/31]
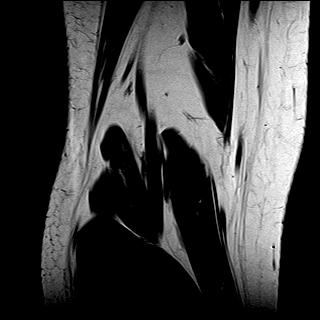
[im 6/31]
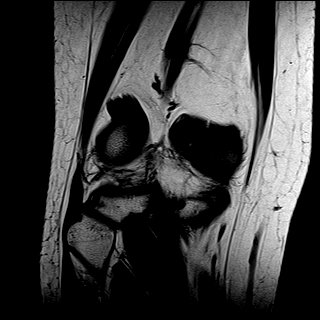
[im 11/31]
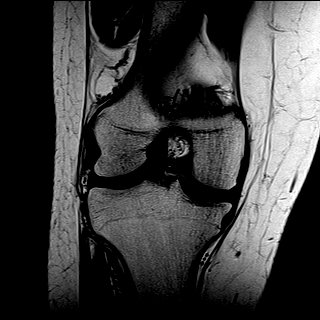
[im 16/31]
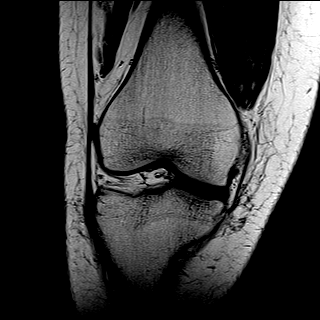
[im 21/31]
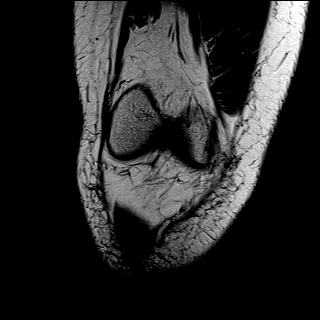
[im 26/31]
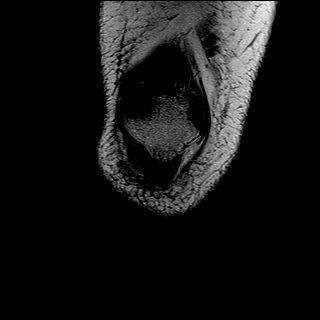
[im 31/31]
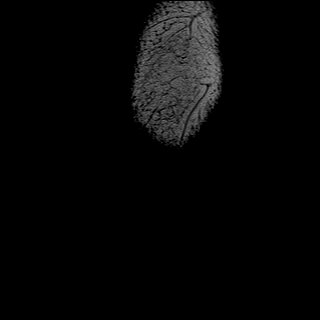

[Series 5: T2 fat-sat · coronal · 3.0mm · 0.50mm/px · 7 of 31 slices shown]
[im 1/31]
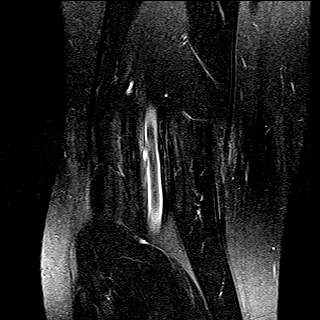
[im 6/31]
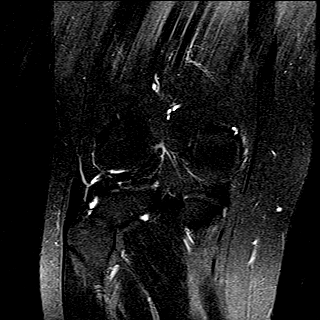
[im 11/31]
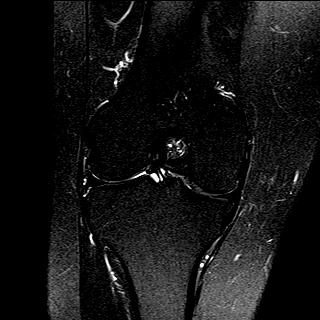
[im 16/31]
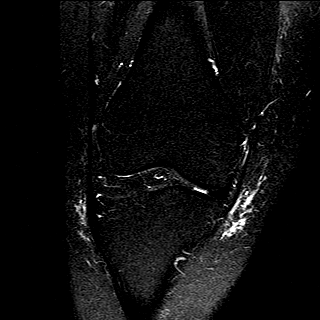
[im 21/31]
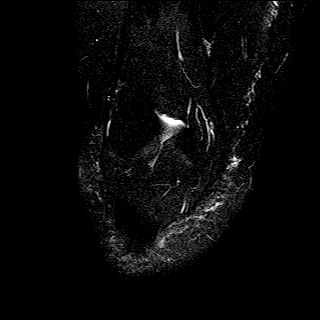
[im 26/31]
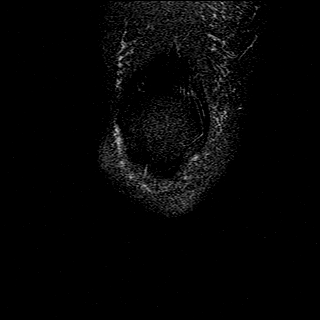
[im 31/31]
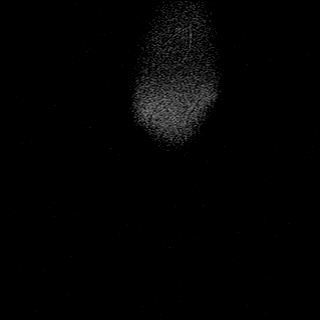

[Series 6: PD fat-sat · coronal · 3.0mm · 0.62mm/px · 7 of 31 slices shown (2 of 4)]
[im 1/31]
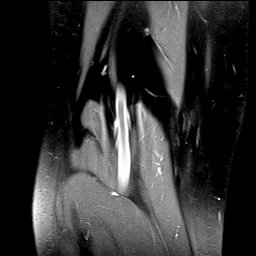
[im 6/31]
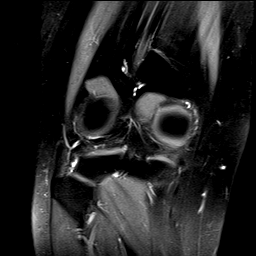
[im 11/31]
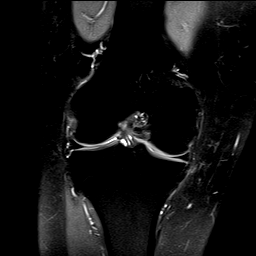
[im 16/31]
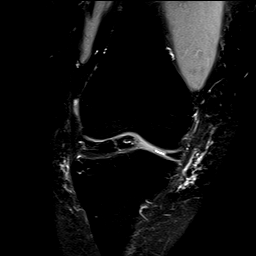
[im 21/31]
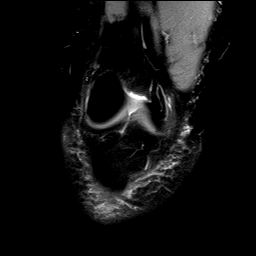
[im 26/31]
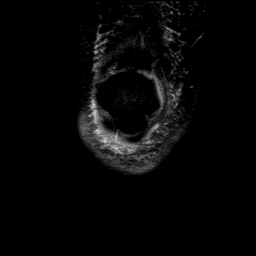
[im 31/31]
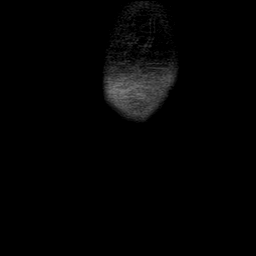

[Series 7: PD fat-sat · sagittal · 3.0mm · 0.62mm/px · 8 of 35 slices shown (3 of 4)]
[im 1/35]
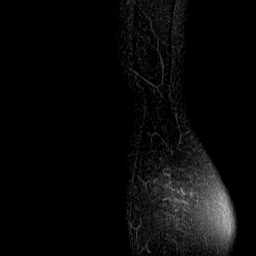
[im 5/35]
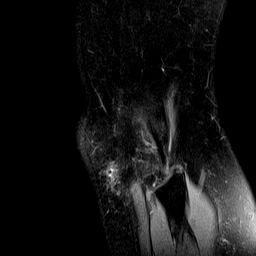
[im 10/35]
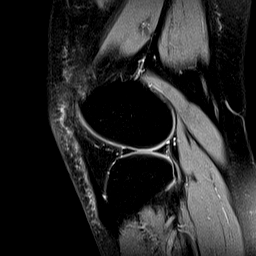
[im 15/35]
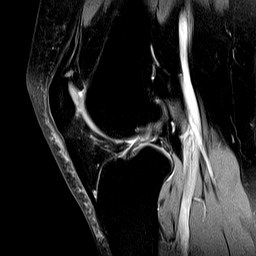
[im 20/35]
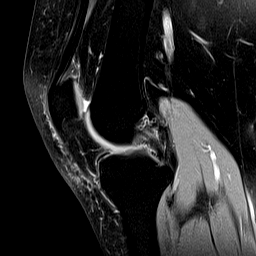
[im 25/35]
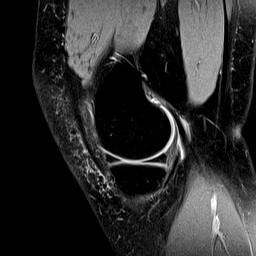
[im 30/35]
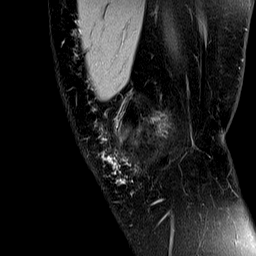
[im 35/35]
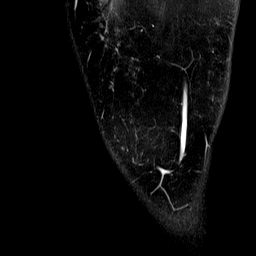

[Series 8: PD fat-sat · coronal · 2.0mm · 0.62mm/px · 4 of 19 slices shown (4 of 4)]
[im 1/19]
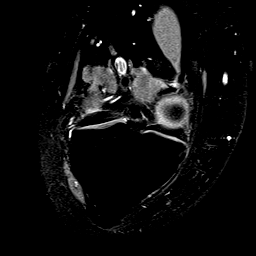
[im 7/19]
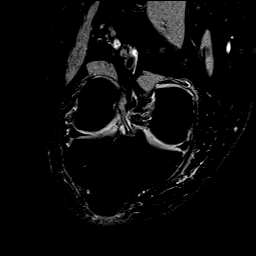
[im 13/19]
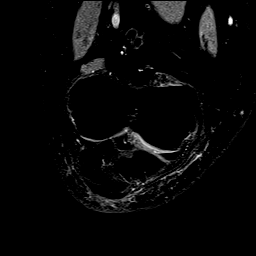
[im 19/19]
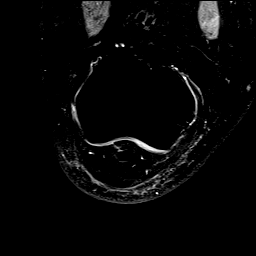

[40 of 40 positions shown; findings below may reference images not displayed]

FINDINGS: MENISCI

Medial meniscus: Mild increased signal at the posterior periphery of
the posterior horn - body junction of the medial meniscus without a
discrete linear component to suggest a tear (image 27/series 7).

Lateral meniscus:  Intact.

LIGAMENTS

Cruciates:  Intact ACL and PCL.

Collaterals: Medial collateral ligament is intact. Lateral
collateral ligament complex is intact.

CARTILAGE

Patellofemoral:  No chondral defect.

Medial: Mild partial-thickness cartilage loss of the medial
femorotibial compartment.

Lateral:  No chondral defect.

Joint: No joint effusion. Normal Hoffa's fat. No plical thickening.
Mild edema in the anterior suprapatellar fat pad.

Popliteal Fossa:  No Baker cyst. Intact popliteus tendon.

Extensor Mechanism: Intact quadriceps tendon. Mild tendinosis of the
proximal and distal patellar tendon. Intact medial and lateral
patellar retinaculum. Intact MPFL.

Bones: No focal marrow signal abnormality. No fracture or
dislocation.

Other: No fluid collection or hematoma.
IMPRESSION: 1. Mild increased signal at the posterior periphery of the posterior
horn - body junction of the medial meniscus without a discrete
linear component to suggest a tear (image 27/series 7). This likely
reflects mild degeneration.
2. Mild partial-thickness cartilage loss of the medial femorotibial
compartment.

## 2020-06-05 ENCOUNTER — Encounter: Payer: BC Managed Care – PPO | Admitting: Family Medicine

## 2020-06-19 ENCOUNTER — Encounter: Payer: BC Managed Care – PPO | Admitting: Family Medicine

## 2020-07-10 ENCOUNTER — Ambulatory Visit (INDEPENDENT_AMBULATORY_CARE_PROVIDER_SITE_OTHER): Payer: BC Managed Care – PPO | Admitting: Family Medicine

## 2020-07-10 ENCOUNTER — Other Ambulatory Visit (HOSPITAL_COMMUNITY)
Admission: RE | Admit: 2020-07-10 | Discharge: 2020-07-10 | Disposition: A | Discharge status: Home / Self Care | Payer: BC Managed Care – PPO | Source: Ambulatory Visit | Attending: Family Medicine | Admitting: Family Medicine

## 2020-07-10 ENCOUNTER — Encounter: Payer: Self-pay | Admitting: Family Medicine

## 2020-07-10 VITALS — BP 126/81 | HR 93 | Ht 64.0 in | Wt 184.0 lb

## 2020-07-10 DIAGNOSIS — Z124 Encounter for screening for malignant neoplasm of cervix: Secondary | ICD-10-CM | POA: Diagnosis present

## 2020-07-10 DIAGNOSIS — Z0000 Encounter for general adult medical examination without abnormal findings: Secondary | ICD-10-CM | POA: Diagnosis present

## 2020-07-10 DIAGNOSIS — Z23 Encounter for immunization: Secondary | ICD-10-CM | POA: Diagnosis not present

## 2020-07-10 NOTE — Progress Notes (Signed)
Subjective:     Sheryl [name] is a 39 y.o. female and is here for a comprehensive physical exam. The patient reports no problems.  She is not exercising regularly.     Her half sister was diagnosed at age 77.  They share a father.    Social History   Socioeconomic History  . Marital status: Single    Spouse name: Not on file  . Number of children: Not on file  . Years of education: Not on file  . Highest education level: Not on file  Occupational History  . Occupation: Surveyor, quantity    Comment: [employer]  Tobacco Use  . Smoking status: Never Smoker  . Smokeless tobacco: Never Used  Substance and Sexual Activity  . Alcohol use: Yes  . Drug use: No  . Sexual activity: Not Currently  Other Topics Concern  . Not on file  Social History Narrative  . Not on file   Social Determinants of Health   Financial Resource Strain:   . Difficulty of Paying Living Expenses: Not on file  Food Insecurity:   . Worried About Charity fundraiser in the Last Year: Not on file  . Ran Out of Food in the Last Year: Not on file  Transportation Needs:   . Lack of Transportation (Medical): Not on file  . Lack of Transportation (Non-Medical): Not on file  Physical Activity:   . Days of Exercise per Week: Not on file  . Minutes of Exercise per Session: Not on file  Stress:   . Feeling of Stress : Not on file  Social Connections:   . Frequency of Communication with Friends and Family: Not on file  . Frequency of Social Gatherings with Friends and Family: Not on file  . Attends Religious Services: Not on file  . Active Member of Clubs or Organizations: Not on file  . Attends Archivist Meetings: Not on file  . Marital Status: Not on file  Intimate Partner Violence:   . Fear of Current or Ex-Partner: Not on file  . Emotionally Abused: Not on file  . Physically Abused: Not on file  . Sexually Abused: Not on file   Health Maintenance  Topic Date Due  . Hepatitis C Screening   Never done  . COVID-19 Vaccine (1) Never done  . HIV Screening  Never done  . TETANUS/TDAP  02/03/2021  . PAP SMEAR-Modifier  02/21/2022  . INFLUENZA VACCINE  Completed    The following portions of the patient's history were reviewed and updated as appropriate: allergies, current medications, past family history, past medical history, past social history, past surgical history and problem list.  Review of Systems Review of systems not obtained due to patient factors.   Objective:    BP 126/81   Pulse 93   Ht 5\' 4"  (1.626 m)   Wt 184 lb (83.5 kg)   LMP 06/25/2020 (Approximate)   SpO2 99%   BMI 31.58 kg/m  General appearance: alert, cooperative and appears stated age Head: Normocephalic, without obvious abnormality, atraumatic Eyes: conj clear, EOMi, PEERLA Ears: normal TM's and external ear canals both ears Nose: Nares normal. Septum midline. Mucosa normal. No drainage or sinus tenderness. Throat: lips, mucosa, and tongue normal; teeth and gums normal Neck: no adenopathy, no carotid bruit, no JVD, supple, symmetrical, trachea midline and thyroid not enlarged, symmetric, no tenderness/mass/nodules Back: symmetric, no curvature. ROM normal. No CVA tenderness. Lungs: clear to auscultation bilaterally Breasts: normal appearance, no masses  or tenderness Heart: regular rate and rhythm, S1, S2 normal, no murmur, click, rub or gallop Abdomen: soft, non-tender; bowel sounds normal; no masses,  no organomegaly Pelvic: cervix normal in appearance, external genitalia normal, no adnexal masses or tenderness, no cervical motion tenderness, rectovaginal septum normal, uterus normal size, shape, and consistency and vagina normal without discharge Extremities: extremities normal, atraumatic, no cyanosis or edema Pulses: 2+ and symmetric Skin: Skin color, texture, turgor normal. No rashes or lesions Lymph nodes: Cervical, supraclavicular, and axillary nodes normal. Neurologic: Alert and  oriented X 3, normal strength and tone. Normal symmetric reflexes. Normal coordination and gait    Assessment:    Healthy female exam.      Plan:     See After Visit Summary for Counseling Recommendations   Keep up a regular exercise program and make sure you are eating a healthy diet Try to eat 4 servings of dairy a day, or if you are lactose intolerant take a calcium with vitamin D daily.  Your vaccines are up to date.

## 2020-07-10 NOTE — Patient Instructions (Signed)
Health Maintenance, Female Adopting a healthy lifestyle and getting preventive care are important in promoting health and wellness. Ask your health care provider about:  The right schedule for you to have regular tests and exams.  Things you can do on your own to prevent diseases and keep yourself healthy. What should I know about diet, weight, and exercise? Eat a healthy diet   Eat a diet that includes plenty of vegetables, fruits, low-fat dairy products, and lean protein.  Do not eat a lot of foods that are high in solid fats, added sugars, or sodium. Maintain a healthy weight Body mass index (BMI) is used to identify weight problems. It estimates body fat based on height and weight. Your health care provider can help determine your BMI and help you achieve or maintain a healthy weight. Get regular exercise Get regular exercise. This is one of the most important things you can do for your health. Most adults should:  Exercise for at least 150 minutes each week. The exercise should increase your heart rate and make you sweat (moderate-intensity exercise).  Do strengthening exercises at least twice a week. This is in addition to the moderate-intensity exercise.  Spend less time sitting. Even light physical activity can be beneficial. Watch cholesterol and blood lipids Have your blood tested for lipids and cholesterol at 38 years of age, then have this test every 5 years. Have your cholesterol levels checked more often if:  Your lipid or cholesterol levels are high.  You are older than 38 years of age.  You are at high risk for heart disease. What should I know about cancer screening? Depending on your health history and family history, you may need to have cancer screening at various ages. This may include screening for:  Breast cancer.  Cervical cancer.  Colorectal cancer.  Skin cancer.  Lung cancer. What should I know about heart disease, diabetes, and high blood  pressure? Blood pressure and heart disease  High blood pressure causes heart disease and increases the risk of stroke. This is more likely to develop in people who have high blood pressure readings, are of African descent, or are overweight.  Have your blood pressure checked: ? Every 3-5 years if you are 18-39 years of age. ? Every year if you are 40 years old or older. Diabetes Have regular diabetes screenings. This checks your fasting blood sugar level. Have the screening done:  Once every three years after age 40 if you are at a normal weight and have a low risk for diabetes.  More often and at a younger age if you are overweight or have a high risk for diabetes. What should I know about preventing infection? Hepatitis B If you have a higher risk for hepatitis B, you should be screened for this virus. Talk with your health care provider to find out if you are at risk for hepatitis B infection. Hepatitis C Testing is recommended for:  Everyone born from 1945 through 1965.  Anyone with known risk factors for hepatitis C. Sexually transmitted infections (STIs)  Get screened for STIs, including gonorrhea and chlamydia, if: ? You are sexually active and are younger than 38 years of age. ? You are older than 38 years of age and your health care provider tells you that you are at risk for this type of infection. ? Your sexual activity has changed since you were last screened, and you are at increased risk for chlamydia or gonorrhea. Ask your health care provider if   you are at risk.  Ask your health care provider about whether you are at high risk for HIV. Your health care provider may recommend a prescription medicine to help prevent HIV infection. If you choose to take medicine to prevent HIV, you should first get tested for HIV. You should then be tested every 3 months for as long as you are taking the medicine. Pregnancy  If you are about to stop having your period (premenopausal) and  you may become pregnant, seek counseling before you get pregnant.  Take 400 to 800 micrograms (mcg) of folic acid every day if you become pregnant.  Ask for birth control (contraception) if you want to prevent pregnancy. Osteoporosis and menopause Osteoporosis is a disease in which the bones lose minerals and strength with aging. This can result in bone fractures. If you are 65 years old or older, or if you are at risk for osteoporosis and fractures, ask your health care provider if you should:  Be screened for bone loss.  Take a calcium or vitamin D supplement to lower your risk of fractures.  Be given hormone replacement therapy (HRT) to treat symptoms of menopause. Follow these instructions at home: Lifestyle  Do not use any products that contain nicotine or tobacco, such as cigarettes, e-cigarettes, and chewing tobacco. If you need help quitting, ask your health care provider.  Do not use street drugs.  Do not share needles.  Ask your health care provider for help if you need support or information about quitting drugs. Alcohol use  Do not drink alcohol if: ? Your health care provider tells you not to drink. ? You are pregnant, may be pregnant, or are planning to become pregnant.  If you drink alcohol: ? Limit how much you use to 0-1 drink a day. ? Limit intake if you are breastfeeding.  Be aware of how much alcohol is in your drink. In the [place] one drink equals one 12 oz bottle of beer (355 mL), one 5 oz glass of wine (148 mL), or one 1 oz glass of hard liquor (44 mL). General instructions  Schedule regular health, dental, and eye exams.  Stay current with your vaccines.  Tell your health care provider if: ? You often feel depressed. ? You have ever been abused or do not feel safe at home. Summary  Adopting a healthy lifestyle and getting preventive care are important in promoting health and wellness.  Follow your health care provider's instructions about healthy  diet, exercising, and getting tested or screened for diseases.  Follow your health care provider's instructions on monitoring your cholesterol and blood pressure. This information is not intended to replace advice given to you by your health care provider. Make sure you discuss any questions you have with your health care provider. Document Revised: 10/03/2018 Document Reviewed: 10/03/2018 Elsevier Patient Education  2020 Elsevier Inc.  

## 2020-07-13 LAB — CYTOLOGY - PAP
Comment: NEGATIVE
Diagnosis: NEGATIVE
High risk HPV: NEGATIVE

## 2020-07-13 MED ORDER — FLUCONAZOLE 150 MG PO TABS: 150.0000 mg | ORAL_TABLET | Freq: Once | ORAL | 0 refills | Status: AC

## 2020-07-13 NOTE — Addendum Note (Signed)
Addended by: Beatrice Lecher D on: 07/13/2020 04:50 PM   Modules accepted: Orders

## 2022-07-22 ENCOUNTER — Encounter: Payer: BC Managed Care – PPO | Admitting: Family Medicine

## 2022-08-04 ENCOUNTER — Ambulatory Visit (INDEPENDENT_AMBULATORY_CARE_PROVIDER_SITE_OTHER): Payer: BC Managed Care – PPO | Admitting: Family Medicine

## 2022-08-04 ENCOUNTER — Encounter: Payer: Self-pay | Admitting: Family Medicine

## 2022-08-04 VITALS — BP 111/75 | HR 85 | Ht 64.0 in | Wt 176.0 lb

## 2022-08-04 DIAGNOSIS — Z23 Encounter for immunization: Secondary | ICD-10-CM | POA: Diagnosis not present

## 2022-08-04 DIAGNOSIS — Z0000 Encounter for general adult medical examination without abnormal findings: Secondary | ICD-10-CM | POA: Diagnosis not present

## 2022-08-04 DIAGNOSIS — N926 Irregular menstruation, unspecified: Secondary | ICD-10-CM | POA: Diagnosis not present

## 2022-08-04 DIAGNOSIS — Z1231 Encounter for screening mammogram for malignant neoplasm of breast: Secondary | ICD-10-CM | POA: Diagnosis not present

## 2022-08-04 DIAGNOSIS — L309 Dermatitis, unspecified: Secondary | ICD-10-CM

## 2022-08-04 MED ORDER — CLOBETASOL PROPIONATE 0.05 % EX OINT: 1.0000 | TOPICAL_OINTMENT | Freq: Every evening | CUTANEOUS | 2 refills | Status: DC | PRN

## 2022-08-04 NOTE — Assessment & Plan Note (Signed)
We will treat with topical steroid cream apply thin layer daily with a good dye free perfume free moisturizing cream on top.  Recommend avoiding prolonged use and giving the skin a break after 2 weeks.

## 2022-08-04 NOTE — Progress Notes (Signed)
Complete physical exam  Patient: Sheryl [name]   DOB: 08-31-1981   41 y.o. Female  MRN: [id number]  Subjective:    Chief Complaint  Patient presents with   Annual Exam    Eowyn Tabone is a 41 y.o. female who presents today for a complete physical exam. She reports consuming a general diet.  Exercising 10-20 min 3 days per week  She generally feels well. She reports sleeping poorly.  She said chronic sleep issues for years.  She has been thinking about maybe trying melatonin.  She does not have additional problems to discuss today.  She has been working on losing a little bit of weight and she is lost about 10 pounds.  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  She has chronic rash and dermatitis on her fingertips and sometimes on her elbows used to have some infected areas on her knees but that actually seems to be better.  She is concerned because her mom has a history of psoriasis.  He also wanted to let me know that she missed her period last month which is a little unusual for her.   Most recent fall risk assessment:    08/04/2022    2:16 PM  [place] in the past year? 0  Number falls in past yr: 0  Injury with Fall? 0  Risk for fall due to : No Fall Risks  Follow up Falls evaluation completed     Most recent depression screenings:    08/04/2022    2:17 PM 02/21/2017    8:55 AM  PHQ 2/9 Scores  PHQ - 2 Score 2 0  PHQ- 9 Score 7         Patient Care Team: Hali Marry, MD as PCP - General   Outpatient Medications Prior to Visit  Medication Sig   [DISCONTINUED] meloxicam (MOBIC) 15 MG tablet One tab PO qAM with breakfast for 4 weeks, then daily prn pain.   No facility-administered medications prior to visit.    ROS        Objective:     BP 111/75   Pulse 85   Ht '5\' 4"'$  (1.626 m)   Wt 176 lb (79.8 kg)   SpO2 97%   BMI 30.21 kg/m    Physical Exam Vitals and nursing note reviewed.  Constitutional:       Appearance: She is well-developed.  HENT:     Head: Normocephalic and atraumatic.     Right Ear: External ear normal.     Left Ear: External ear normal.     Nose: Nose normal.  Eyes:     Conjunctiva/sclera: Conjunctivae normal.     Pupils: Pupils are equal, round, and reactive to light.  Neck:     Thyroid: No thyromegaly.  Cardiovascular:     Rate and Rhythm: Normal rate and regular rhythm.     Heart sounds: Normal heart sounds.  Pulmonary:     Effort: Pulmonary effort is normal.     Breath sounds: Normal breath sounds. No wheezing.  Musculoskeletal:     Cervical back: Neck supple.  Lymphadenopathy:     Cervical: No cervical adenopathy.  Skin:    General: Skin is warm and dry.  Neurological:     Mental Status: She is alert and oriented to person, place, and time.  Psychiatric:        Behavior: Behavior normal.      No results found for  any visits on 08/04/22.     Assessment & Plan:    Routine Health Maintenance and Physical Exam  Immunization History  Administered Date(s) Administered   Influenza,inj,Quad PF,6+ Mos 07/10/2020, 08/04/2022   Tdap 02/04/2011, 08/04/2022    Health Maintenance  Topic Date Due   COVID-19 Vaccine (1) Never done   HIV Screening  Never done   Hepatitis C Screening  Never done   PAP SMEAR-Modifier  07/10/2025   TETANUS/TDAP  08/04/2032   INFLUENZA VACCINE  Completed   Pneumococcal Vaccine 42-64 Years old  Aged Out   HPV VACCINES  Aged Out    Discussed health benefits of physical activity, and encouraged her to engage in regular exercise appropriate for her age and condition.  Problem List Items Addressed This Visit       Musculoskeletal and Integument   Eczema    We will treat with topical steroid cream apply thin layer daily with a good dye free perfume free moisturizing cream on top.  Recommend avoiding prolonged use and giving the skin a break after 2 weeks.      Relevant Medications   clobetasol ointment (TEMOVATE) 0.05 %    Other Visit Diagnoses     Need for Tdap vaccination    -  Primary   Relevant Orders   Tdap vaccine greater than or equal to 7yo IM (Completed)   Flu vaccine need       Relevant Orders   Flu Vaccine QUAD 27moIM (Fluarix, Fluzone & Alfiuria Quad PF) (Completed)   Wellness examination       Relevant Orders   CBC w/Diff/Platelet   Lipid Panel w/reflex Direct LDL   TSH   COMPLETE METABOLIC PANEL WITH GFR   Irregular periods       Relevant Orders   CBC w/Diff/Platelet   Lipid Panel w/reflex Direct LDL   TSH   Screening mammogram, encounter for       Relevant Orders   MM 3D SCREEN BREAST BILATERAL       Keep up a regular exercise program and make sure you are eating a healthy diet Try to eat 4 servings of dairy a day, or if you are lactose intolerant take a calcium with vitamin D daily.  Your vaccines are up to date.   Irregular.-We will check a TSH.  We did discuss just keeping an eye on it if she starts having more irregularity then we can work-up further.  She does not remember any specific stressors or changes that would have altered her cycle.    Return in about 1 year (around 08/05/2023) for Wellness Exam.     CBeatrice Lecher MD

## 2022-08-05 NOTE — Progress Notes (Signed)
Hi Sheryl [name], cholesterol is up just a little bit.  Just continue to work on healthy diet and regular exercise.  Blood count and thyroid is normal.  The metabolic panel looks good.  Please call the lab and see if we can add a hepatitis C screen and HIV screen.  I meant to put these on her labs yesterday and she had said she was okay with that I just forgot to add it.

## 2022-08-08 DIAGNOSIS — Z23 Encounter for immunization: Secondary | ICD-10-CM | POA: Diagnosis not present

## 2022-08-08 NOTE — Addendum Note (Signed)
Addended by: Rae Lips on: 08/08/2022 11:32 AM   Modules accepted: Orders

## 2022-08-10 NOTE — Progress Notes (Signed)
Negative for hepatitis C.

## 2022-08-18 LAB — CBC WITH DIFFERENTIAL/PLATELET
Absolute Monocytes: 581 cells/uL (ref 200–950)
Basophils Absolute: 51 cells/uL (ref 0–200)
Basophils Relative: 0.5 %
Eosinophils Absolute: 122 cells/uL (ref 15–500)
Eosinophils Relative: 1.2 %
HCT: 38.8 % (ref 35.0–45.0)
Hemoglobin: 13.4 g/dL (ref 11.7–15.5)
Lymphs Abs: 2315 cells/uL (ref 850–3900)
MCH: 31.2 pg (ref 27.0–33.0)
MCHC: 34.5 g/dL (ref 32.0–36.0)
MCV: 90.2 fL (ref 80.0–100.0)
MPV: 8.9 fL (ref 7.5–12.5)
Monocytes Relative: 5.7 %
Neutro Abs: 7130 cells/uL (ref 1500–7800)
Neutrophils Relative %: 69.9 %
Platelets: 289 10*3/uL (ref 140–400)
RBC: 4.3 10*6/uL (ref 3.80–5.10)
RDW: 12.5 % (ref 11.0–15.0)
Total Lymphocyte: 22.7 %
WBC: 10.2 10*3/uL (ref 3.8–10.8)

## 2022-08-18 LAB — COMPLETE METABOLIC PANEL WITH GFR
AG Ratio: 1.2 (calc) (ref 1.0–2.5)
ALT: 7 U/L (ref 6–29)
AST: 11 U/L (ref 10–30)
Albumin: 4.1 g/dL (ref 3.6–5.1)
Alkaline phosphatase (APISO): 43 U/L (ref 31–125)
BUN: 9 mg/dL (ref 7–25)
CO2: 25 mmol/L (ref 20–32)
Calcium: 9 mg/dL (ref 8.6–10.2)
Chloride: 105 mmol/L (ref 98–110)
Creat: 0.71 mg/dL (ref 0.50–0.99)
Globulin: 3.3 g/dL (calc) (ref 1.9–3.7)
Glucose, Bld: 77 mg/dL (ref 65–99)
Potassium: 4.1 mmol/L (ref 3.5–5.3)
Sodium: 140 mmol/L (ref 135–146)
Total Bilirubin: 0.3 mg/dL (ref 0.2–1.2)
Total Protein: 7.4 g/dL (ref 6.1–8.1)
eGFR: 110 mL/min/{1.73_m2} (ref >=60)

## 2022-08-18 LAB — LIPID PANEL W/REFLEX DIRECT LDL
Cholesterol: 166 mg/dL (ref <200)
HDL: 49 mg/dL — ABNORMAL LOW (ref >=50)
LDL Cholesterol (Calc): 102 mg/dL (calc) — ABNORMAL HIGH
Non-HDL Cholesterol (Calc): 117 mg/dL (calc) (ref <130)
Total CHOL/HDL Ratio: 3.4 (calc) (ref <5.0)
Triglycerides: 65 mg/dL (ref <150)

## 2022-08-18 LAB — HEPATITIS C AB W/RFL RNA, PCR + GENO: Hepatitis C Ab: NONREACTIVE

## 2022-08-18 LAB — TSH: TSH: 1.95 mIU/L

## 2022-09-21 ENCOUNTER — Ambulatory Visit
Admission: RE | Admit: 2022-09-21 | Discharge: 2022-09-21 | Disposition: A | Discharge status: Home / Self Care | Payer: BC Managed Care – PPO | Source: Ambulatory Visit | Attending: Family Medicine | Admitting: Family Medicine

## 2022-09-21 DIAGNOSIS — Z1231 Encounter for screening mammogram for malignant neoplasm of breast: Secondary | ICD-10-CM

## 2022-09-21 NOTE — Progress Notes (Signed)
HI Sheryl [name],  The screening mammogram showed some questionable areas in both breasts that show a possible mass.  The imaging department will be contacting you to get you scheduled for additional imaging including diagnostic mammogram and possible ultrasound to work these areas up further.

## 2022-09-22 ENCOUNTER — Other Ambulatory Visit: Payer: Self-pay | Admitting: Family Medicine

## 2022-09-22 DIAGNOSIS — R928 Other abnormal and inconclusive findings on diagnostic imaging of breast: Secondary | ICD-10-CM

## 2022-10-05 ENCOUNTER — Ambulatory Visit
Admission: RE | Admit: 2022-10-05 | Discharge: 2022-10-05 | Disposition: A | Discharge status: Home / Self Care | Payer: BC Managed Care – PPO | Source: Ambulatory Visit | Attending: Family Medicine | Admitting: Family Medicine

## 2022-10-05 DIAGNOSIS — R928 Other abnormal and inconclusive findings on diagnostic imaging of breast: Secondary | ICD-10-CM

## 2023-10-19 ENCOUNTER — Ambulatory Visit (INDEPENDENT_AMBULATORY_CARE_PROVIDER_SITE_OTHER): Payer: BC Managed Care – PPO | Admitting: Family Medicine

## 2023-10-19 ENCOUNTER — Encounter: Payer: Self-pay | Admitting: Family Medicine

## 2023-10-19 VITALS — BP 110/65 | HR 81 | Wt 167.0 lb

## 2023-10-19 DIAGNOSIS — Z0000 Encounter for general adult medical examination without abnormal findings: Secondary | ICD-10-CM | POA: Diagnosis not present

## 2023-10-19 DIAGNOSIS — Z23 Encounter for immunization: Secondary | ICD-10-CM

## 2023-10-19 DIAGNOSIS — L309 Dermatitis, unspecified: Secondary | ICD-10-CM

## 2023-10-19 DIAGNOSIS — G2581 Restless legs syndrome: Secondary | ICD-10-CM | POA: Diagnosis not present

## 2023-10-19 DIAGNOSIS — Z1231 Encounter for screening mammogram for malignant neoplasm of breast: Secondary | ICD-10-CM

## 2023-10-19 MED ORDER — CLOBETASOL PROPIONATE 0.05 % EX OINT: 1.0000 | TOPICAL_OINTMENT | Freq: Every evening | CUTANEOUS | 2 refills | Status: DC | PRN

## 2023-10-19 NOTE — Progress Notes (Signed)
Complete physical exam  Patient: Sheryl [name]   DOB: Mar 22, 1981   42 y.o. Female  MRN: [id number]  Subjective:    Chief Complaint  Patient presents with   Annual Exam    Sheryl [name] is a 42 y.o. female who presents today for a complete physical exam. She reports consuming a general diet.  Works out 2-3 days per week.   She generally feels well. She reports sleeping fairly well. She does have additional problems to discuss today.   Does describe a restlessness particularly in her right thigh is not really painful it just feels like sometimes she has to move it.  No recent trauma or injury to her thigh or her back.  Most recent fall risk assessment:    10/19/2023   10:48 AM  Fall Risk   Falls in the past year? 0  Number falls in past yr: 0  Injury with Fall? 0  Risk for fall due to : No Fall Risks  Follow up Falls evaluation completed     Most recent depression screenings:    10/19/2023   10:48 AM 08/04/2022    2:17 PM  PHQ 2/9 Scores  PHQ - 2 Score 0 2  PHQ- 9 Score  7         Patient Care Team: Agapito Games, MD as PCP - General   Outpatient Medications Prior to Visit  Medication Sig   [DISCONTINUED] clobetasol ointment (TEMOVATE) 0.05 % Apply 1 Application topically at bedtime as needed.   No facility-administered medications prior to visit.    ROS        Objective:     BP 110/65   Pulse 81   Wt 167 lb (75.8 kg)   SpO2 98%   BMI 28.67 kg/m     Physical Exam Exam conducted with a chaperone present.  Constitutional:      Appearance: Normal appearance.  HENT:     Head: Normocephalic and atraumatic.     Right Ear: Tympanic membrane, ear canal and external ear normal.     Left Ear: Tympanic membrane, ear canal and external ear normal.     Nose: Nose normal.     Mouth/Throat:     Pharynx: Oropharynx is clear.  Eyes:     Extraocular Movements: Extraocular movements intact.     Conjunctiva/sclera: Conjunctivae normal.      Pupils: Pupils are equal, round, and reactive to light.  Neck:     Thyroid: No thyromegaly.  Cardiovascular:     Rate and Rhythm: Normal rate and regular rhythm.  Pulmonary:     Effort: Pulmonary effort is normal.     Breath sounds: Normal breath sounds.  Chest:     Chest wall: No mass.  Breasts:    Right: Normal. No mass, nipple discharge or skin change.     Left: Normal. No mass, nipple discharge or skin change.  Abdominal:     General: Bowel sounds are normal.     Palpations: Abdomen is soft.     Tenderness: There is no abdominal tenderness.  Musculoskeletal:        General: No swelling.     Cervical back: Neck supple.  Lymphadenopathy:     Upper Body:     Right upper body: No supraclavicular, axillary or pectoral adenopathy.     Left upper body: No supraclavicular, axillary or pectoral adenopathy.  Skin:    General: Skin is warm and dry.  Neurological:     Mental Status: She  is oriented to person, place, and time.  Psychiatric:        Mood and Affect: Mood normal.        Behavior: Behavior normal.      No results found for any visits on 10/19/23.      Assessment & Plan:    Routine Health Maintenance and Physical Exam  Immunization History  Administered Date(s) Administered   Influenza, Seasonal, Injecte, Preservative Fre 10/19/2023   Influenza,inj,Quad PF,6+ Mos 07/10/2020, 08/04/2022   Pfizer(Comirnaty)Fall Seasonal Vaccine 12 years and older 08/08/2022   Tdap 02/04/2011, 08/04/2022    Health Maintenance  Topic Date Due   HIV Screening  Never done   COVID-19 Vaccine (2 - 2024-25 season) 06/25/2023   Cervical Cancer Screening (HPV/Pap Cotest)  07/10/2025   DTaP/Tdap/Td (3 - Td or Tdap) 08/04/2032   INFLUENZA VACCINE  Completed   Hepatitis C Screening  Completed   HPV VACCINES  Aged Out    Discussed health benefits of physical activity, and encouraged her to engage in regular exercise appropriate for her age and condition.  Problem List Items Addressed  This Visit       Musculoskeletal and Integument   Eczema   Relevant Medications   clobetasol ointment (TEMOVATE) 0.05 %   Other Visit Diagnoses       Wellness examination    -  Primary   Relevant Orders   CMP14+EGFR   Lipid panel   CBC   Fe+TIBC+Fer     Encounter for immunization       Relevant Orders   Flu vaccine trivalent PF, 6mos and older(Flulaval,Afluria,Fluarix,Fluzone) (Completed)     Screening mammogram for breast cancer       Relevant Orders   MM 3D SCREENING MAMMOGRAM BILATERAL BREAST     Restless leg       Relevant Orders   CMP14+EGFR   Lipid panel   CBC   Fe+TIBC+Fer      No follow-ups on file.     Nani Gasser, MD

## 2023-10-20 LAB — CMP14+EGFR
ALT: 11 [IU]/L (ref 0–32)
AST: 11 [IU]/L (ref 0–40)
Albumin: 4.2 g/dL (ref 3.9–4.9)
Alkaline Phosphatase: 54 [IU]/L (ref 44–121)
BUN/Creatinine Ratio: 14 (ref 9–23)
BUN: 12 mg/dL (ref 6–24)
Bilirubin Total: 0.2 mg/dL (ref 0.0–1.2)
CO2: 24 mmol/L (ref 20–29)
Calcium: 9.2 mg/dL (ref 8.7–10.2)
Chloride: 104 mmol/L (ref 96–106)
Creatinine, Ser: 0.84 mg/dL (ref 0.57–1.00)
Globulin, Total: 2.9 g/dL (ref 1.5–4.5)
Glucose: 83 mg/dL (ref 70–99)
Potassium: 4 mmol/L (ref 3.5–5.2)
Sodium: 139 mmol/L (ref 134–144)
Total Protein: 7.1 g/dL (ref 6.0–8.5)
eGFR: 89 mL/min/{1.73_m2} (ref >59)

## 2023-10-20 LAB — CBC
Hematocrit: 41.7 % (ref 34.0–46.6)
Hemoglobin: 13.6 g/dL (ref 11.1–15.9)
MCH: 30.3 pg (ref 26.6–33.0)
MCHC: 32.6 g/dL (ref 31.5–35.7)
MCV: 93 fL (ref 79–97)
Platelets: 294 10*3/uL (ref 150–450)
RBC: 4.49 x10E6/uL (ref 3.77–5.28)
RDW: 12.7 % (ref 11.7–15.4)
WBC: 8.1 10*3/uL (ref 3.4–10.8)

## 2023-10-20 LAB — LIPID PANEL
Chol/HDL Ratio: 3.5 {ratio} (ref 0.0–4.4)
Cholesterol, Total: 166 mg/dL (ref 100–199)
HDL: 47 mg/dL (ref >39)
LDL Chol Calc (NIH): 105 mg/dL — ABNORMAL HIGH (ref 0–99)
Triglycerides: 72 mg/dL (ref 0–149)
VLDL Cholesterol Cal: 14 mg/dL (ref 5–40)

## 2023-10-20 LAB — IRON,TIBC AND FERRITIN PANEL
Ferritin: 32 ng/mL (ref 15–150)
Iron Saturation: 26 % (ref 15–55)
Iron: 81 ug/dL (ref 27–159)
Total Iron Binding Capacity: 308 ug/dL (ref 250–450)
UIBC: 227 ug/dL (ref 131–425)

## 2023-10-23 NOTE — Progress Notes (Signed)
LDL cholesterol just slightly elevated at 105 goal is under 100 continue to work on healthy diet and regular exercise.  Metabolic panel and blood count are normal.  Iron stores are normal but a little on the low end so just make sure you are getting iron rich foods in your diet.

## 2024-10-31 ENCOUNTER — Ambulatory Visit: Payer: Self-pay | Admitting: Family Medicine

## 2024-10-31 VITALS — BP 127/72 | HR 80 | Ht 64.0 in | Wt 164.0 lb

## 2024-10-31 DIAGNOSIS — Z0000 Encounter for general adult medical examination without abnormal findings: Secondary | ICD-10-CM | POA: Diagnosis not present

## 2024-10-31 DIAGNOSIS — L309 Dermatitis, unspecified: Secondary | ICD-10-CM

## 2024-10-31 DIAGNOSIS — Z23 Encounter for immunization: Secondary | ICD-10-CM | POA: Diagnosis not present

## 2024-10-31 MED ORDER — CLOBETASOL PROPIONATE 0.05 % EX OINT: 1.0000 | TOPICAL_OINTMENT | Freq: Every evening | CUTANEOUS | 2 refills | Status: AC | PRN

## 2024-10-31 NOTE — Progress Notes (Signed)
 "  Complete physical exam  Patient: Sheryl [name]    DOB: December 02, 1980 44 y.o.   MRN: [id number]  Chief Complaint  Patient presents with   Annual Exam    Subjective:    Sheryl [name] is a 44 y.o. female who presents today for a complete physical exam. She reports consuming a general diet. Exercises with walking pad and weights She generally feels well. She reports sleeping fair. She does not have additional problems to discuss today.   Discussed the use of AI scribe software for clinical note transcription with the patient, who gave verbal consent to proceed.  History of Present Illness Sheryl [name] is a 44 year old female who presents for an annual physical exam.  Sleep disturbance - Sleep described as 'so-so' with lifelong difficulty achieving restful sleep - Melatonin and sleep time tea provide partial improvement - Occasional caffeine intake may contribute to sleep issues  Dermatologic symptoms - Uses ointment mixed with psoriasis lotion for skin moisturizing  Lifestyle modification - Recently resumed exercise routine - Utilizes Walking pad for exercise - Initiated weight training with adjustable dumbbells  Cardiopulmonary symptoms - No recent chest pain - No breathing problems   Most recent fall risk assessment:    10/31/2024    1:41 PM  Fall Risk   Falls in the past year? 0  Number falls in past yr: 0  Injury with Fall? 0     Most recent depression screenings:    10/31/2024    1:40 PM 10/19/2023   10:48 AM  PHQ 2/9 Scores  PHQ - 2 Score 0 0  PHQ- 9 Score 5         Patient Care Team: Alvan Dorothyann BIRCH, MD as PCP - General   ROS    Objective:    BP 127/72   Pulse 80   Ht 5' 4 (1.626 m)   Wt 164 lb (74.4 kg)   SpO2 100%   BMI 28.15 kg/m     Physical Exam Constitutional:      Appearance: Normal appearance.  HENT:     Head: Normocephalic and atraumatic.     Right Ear: Tympanic membrane, ear canal and external ear normal.      Left Ear: Tympanic membrane, ear canal and external ear normal.     Nose: Nose normal.     Mouth/Throat:     Pharynx: Oropharynx is clear.  Eyes:     Extraocular Movements: Extraocular movements intact.     Conjunctiva/sclera: Conjunctivae normal.     Pupils: Pupils are equal, round, and reactive to light.  Neck:     Thyroid: No thyromegaly.  Cardiovascular:     Rate and Rhythm: Normal rate and regular rhythm.  Pulmonary:     Effort: Pulmonary effort is normal.     Breath sounds: Normal breath sounds.  Abdominal:     General: Bowel sounds are normal.     Palpations: Abdomen is soft.     Tenderness: There is no abdominal tenderness.  Musculoskeletal:        General: No swelling.     Cervical back: Neck supple.  Skin:    General: Skin is warm and dry.  Neurological:     Mental Status: She is oriented to person, place, and time.  Psychiatric:        Mood and Affect: Mood normal.        Behavior: Behavior normal.       No results found for any visits on 10/31/24.  Assessment & Plan:    Routine Health Maintenance and Physical Exam Immunization History  Administered Date(s) Administered   Influenza, Seasonal, Injecte, Preservative Fre 10/19/2023, 10/31/2024   Influenza,inj,Quad PF,6+ Mos 07/10/2020, 08/04/2022   Pfizer(Comirnaty)Fall Seasonal Vaccine 12 years and older 08/08/2022   Tdap 02/04/2011, 08/04/2022    Health Maintenance  Topic Date Due   HIV Screening  Never done   Mammogram  10/05/2024   COVID-19 Vaccine (2 - 2025-26 season) 11/16/2024 (Originally 06/24/2024)   Hepatitis B Vaccines 19-59 Average Risk (1 of 3 - 19+ 3-dose series) 10/31/2025 (Originally 11/11/2000)   HPV VACCINES (1 - 3-dose SCDM series) 10/31/2025 (Originally 11/11/2008)   Cervical Cancer Screening (HPV/Pap Cotest)  07/10/2025   DTaP/Tdap/Td (3 - Td or Tdap) 08/04/2032   Influenza Vaccine  Completed   Hepatitis C Screening  Completed   Pneumococcal Vaccine  Aged Out   Meningococcal B  Vaccine  Aged Out    Discussed health benefits of physical activity, and encouraged her to engage in regular exercise appropriate for her age and condition.  Problem List Items Addressed This Visit       Musculoskeletal and Integument   Eczema   Relevant Medications   clobetasol  ointment (TEMOVATE ) 0.05 %   Other Visit Diagnoses       Immunization due    -  Primary   Relevant Orders   Flu vaccine trivalent PF, 6mos and older(Flulaval,Afluria,Fluarix,Fluzone) (Completed)     Wellness examination       Relevant Orders   CMP14+EGFR   Lipid Panel With LDL/HDL Ratio   CBC with Differential   HIV antibody (with reflex)       Assessment and Plan Assessment & Plan General Health Maintenance Routine health maintenance discussed, including screenings and vaccinations. Family history of hyperlipidemia and hypertension noted. No family history of diabetes or colon cancer. - Ordered routine blood work including kidney, liver, electrolytes, and cholesterol. - Administered flu shot. - Scheduled mammogram. - Discussed colon cancer screening starting at age 55. - Encouraged reduction of caffeine intake to improve sleep quality. - Keep up the exercise!     Return in about 1 year (around 10/31/2025) for Wellness Exam.    Dorothyann Byars, MD [facility] at [facility]    "

## 2024-11-01 ENCOUNTER — Ambulatory Visit: Payer: Self-pay | Admitting: Family Medicine

## 2024-11-01 ENCOUNTER — Encounter: Payer: Self-pay | Admitting: Family Medicine

## 2024-11-01 LAB — HIV ANTIBODY (ROUTINE TESTING W REFLEX): HIV Screen 4th Generation wRfx: NONREACTIVE

## 2024-11-01 LAB — CBC WITH DIFFERENTIAL/PLATELET
Basophils Absolute: 0.1 x10E3/uL (ref 0.0–0.2)
Basos: 1 %
EOS (ABSOLUTE): 0.2 x10E3/uL (ref 0.0–0.4)
Eos: 2 %
Hematocrit: 43.5 % (ref 34.0–46.6)
Hemoglobin: 14.1 g/dL (ref 11.1–15.9)
Immature Grans (Abs): 0 x10E3/uL (ref 0.0–0.1)
Immature Granulocytes: 0 %
Lymphocytes Absolute: 2.8 x10E3/uL (ref 0.7–3.1)
Lymphs: 30 %
MCH: 30.6 pg (ref 26.6–33.0)
MCHC: 32.4 g/dL (ref 31.5–35.7)
MCV: 94 fL (ref 79–97)
Monocytes Absolute: 0.7 x10E3/uL (ref 0.1–0.9)
Monocytes: 8 %
Neutrophils Absolute: 5.4 x10E3/uL (ref 1.4–7.0)
Neutrophils: 59 %
Platelets: 307 x10E3/uL (ref 150–450)
RBC: 4.61 x10E6/uL (ref 3.77–5.28)
RDW: 12.2 % (ref 11.7–15.4)
WBC: 9.2 x10E3/uL (ref 3.4–10.8)

## 2024-11-01 LAB — CMP14+EGFR
ALT: 11 IU/L (ref 0–32)
AST: 13 IU/L (ref 0–40)
Albumin: 4.6 g/dL (ref 3.9–4.9)
Alkaline Phosphatase: 57 IU/L (ref 41–116)
BUN/Creatinine Ratio: 15 (ref 9–23)
BUN: 12 mg/dL (ref 6–24)
Bilirubin Total: 0.4 mg/dL (ref 0.0–1.2)
CO2: 25 mmol/L (ref 20–29)
Calcium: 9.7 mg/dL (ref 8.7–10.2)
Chloride: 102 mmol/L (ref 96–106)
Creatinine, Ser: 0.82 mg/dL (ref 0.57–1.00)
Globulin, Total: 3.1 g/dL (ref 1.5–4.5)
Glucose: 78 mg/dL (ref 70–99)
Potassium: 4.2 mmol/L (ref 3.5–5.2)
Sodium: 139 mmol/L (ref 134–144)
Total Protein: 7.7 g/dL (ref 6.0–8.5)
eGFR: 92 mL/min/1.73

## 2024-11-01 LAB — LIPID PANEL WITH LDL/HDL RATIO
Cholesterol, Total: 174 mg/dL (ref 100–199)
HDL: 53 mg/dL
LDL Chol Calc (NIH): 111 mg/dL — ABNORMAL HIGH (ref 0–99)
LDL/HDL Ratio: 2.1 ratio (ref 0.0–3.2)
Triglycerides: 50 mg/dL (ref 0–149)
VLDL Cholesterol Cal: 10 mg/dL (ref 5–40)

## 2024-11-01 NOTE — Progress Notes (Signed)
 Hi Tyaisha, LDL slightly elevated just continue to work on healthy Mediterranean diet and regular exercise.  Metabolic panel including liver and kidney function looks good.  Blood counts normal no sign of anemia.  Screening for HIV was negative.

## 2025-11-03 ENCOUNTER — Encounter: Admitting: Family Medicine
